# Patient Record
Sex: Male | Born: 1975 | Race: White | Hispanic: No | Marital: Married | Smoking: Never smoker
Health system: Southern US, Community
[De-identification: ages and names within clinical notes are randomized; demographics above are authoritative.]

## PROBLEM LIST (undated history)

## (undated) DIAGNOSIS — Z8619 Personal history of other infectious and parasitic diseases: Secondary | ICD-10-CM

## (undated) DIAGNOSIS — G4730 Sleep apnea, unspecified: Secondary | ICD-10-CM

## (undated) DIAGNOSIS — Z8639 Personal history of other endocrine, nutritional and metabolic disease: Secondary | ICD-10-CM

## (undated) DIAGNOSIS — E079 Disorder of thyroid, unspecified: Secondary | ICD-10-CM

## (undated) HISTORY — DX: Personal history of other endocrine, nutritional and metabolic disease: Z86.39

## (undated) HISTORY — DX: Personal history of other infectious and parasitic diseases: Z86.19

---
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
## 2000-03-29 ENCOUNTER — Encounter: Payer: Self-pay | Admitting: Emergency Medicine

## 2000-03-29 ENCOUNTER — Emergency Department (HOSPITAL_COMMUNITY): Admission: EM | Admit: 2000-03-29 | Discharge: 2000-03-29 | Payer: Self-pay | Admitting: Emergency Medicine

## 2008-06-08 ENCOUNTER — Encounter (INDEPENDENT_AMBULATORY_CARE_PROVIDER_SITE_OTHER): Payer: Self-pay | Admitting: Surgery

## 2008-06-08 ENCOUNTER — Ambulatory Visit (HOSPITAL_COMMUNITY): Admission: EM | Admit: 2008-06-08 | Discharge: 2008-06-09 | Payer: Self-pay | Admitting: Emergency Medicine

## 2008-06-08 ENCOUNTER — Encounter: Admission: RE | Admit: 2008-06-08 | Discharge: 2008-06-08 | Payer: Self-pay | Admitting: Family Medicine

## 2008-10-24 ENCOUNTER — Emergency Department (HOSPITAL_BASED_OUTPATIENT_CLINIC_OR_DEPARTMENT_OTHER): Admission: EM | Admit: 2008-10-24 | Discharge: 2008-10-24 | Payer: Self-pay | Admitting: Emergency Medicine

## 2009-07-16 IMAGING — CT CT PELVIS W/ CM
2 of 5 series · 13 of 32 positions shown, 18 images · IV contrast (30CC OMNI 350 & [ID] OMNI 300)
Comparison: None

CT ABDOMEN

CLINICAL DATA: RIGHT LOWER QUADRANT AND PELVIC PAIN FOR 4 DAYS.
ELEVATED WHITE BLOOD CELL COUNT.

CT ABDOMEN AND PELVIS WITH CONTRAST
TECHNIQUE: Multidetector CT imaging of the abdomen and pelvis was
performed following the standard protocol following the bolus
administration of intravenous contrast.
Contrast: 100 ml [id number]

[Series 2: abdomen w/ · axial · 0.76mm/px · z∈[-369,-9]mm · 7 of 98 slices shown, 12 images]
[im 13/98  soft-tissue]
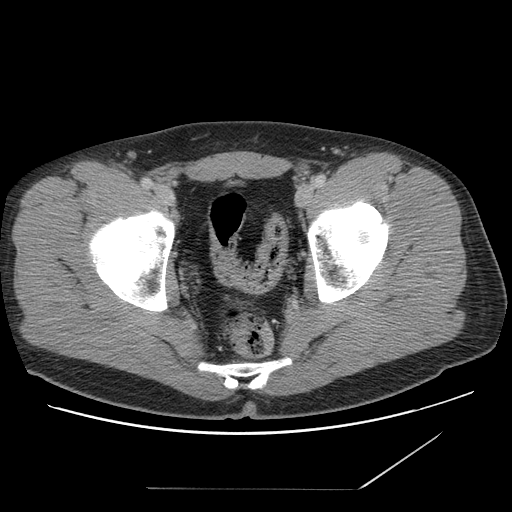
[im 13/98  bone]
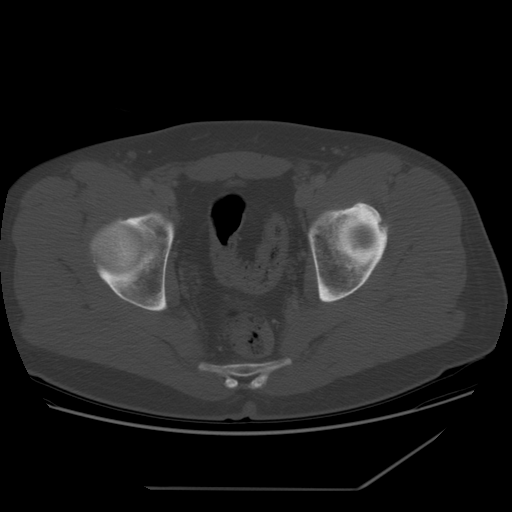
[im 25/98  soft-tissue]
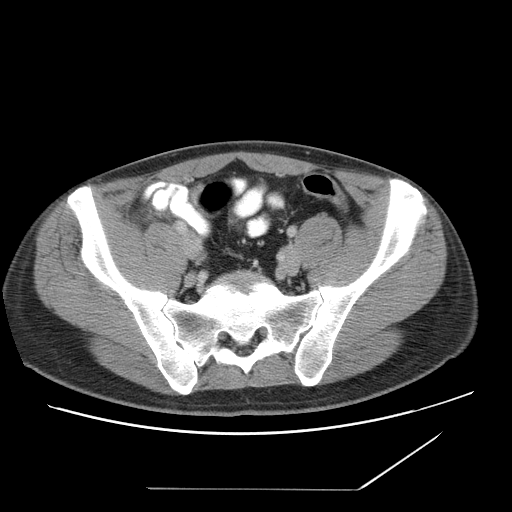
[im 37/98  soft-tissue]
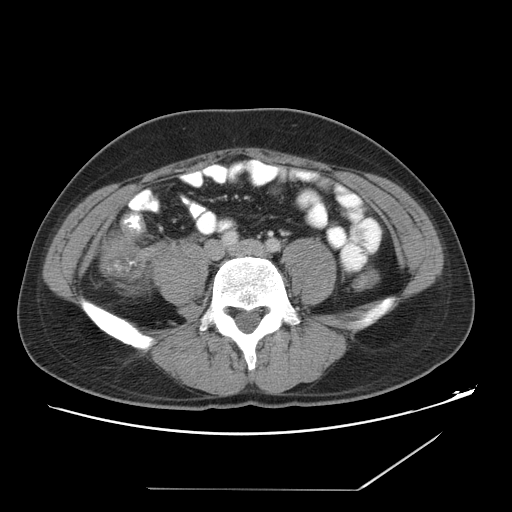
[im 49/98  soft-tissue]
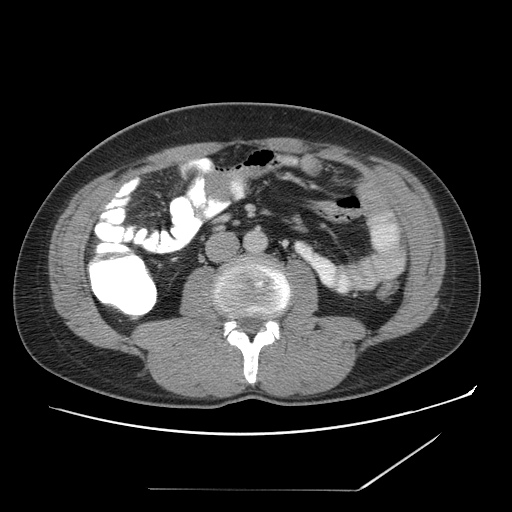
[im 49/98  lung]
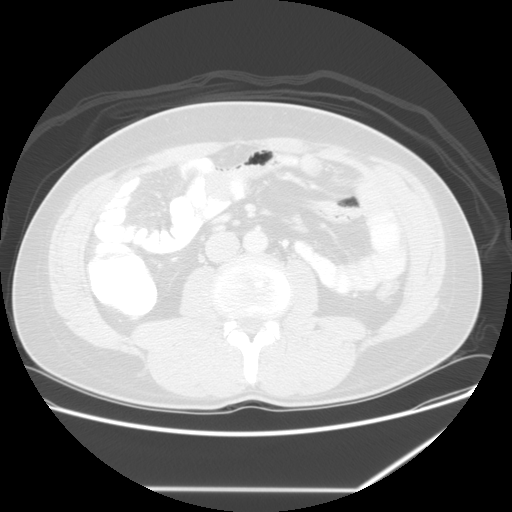
[im 61/98  soft-tissue]
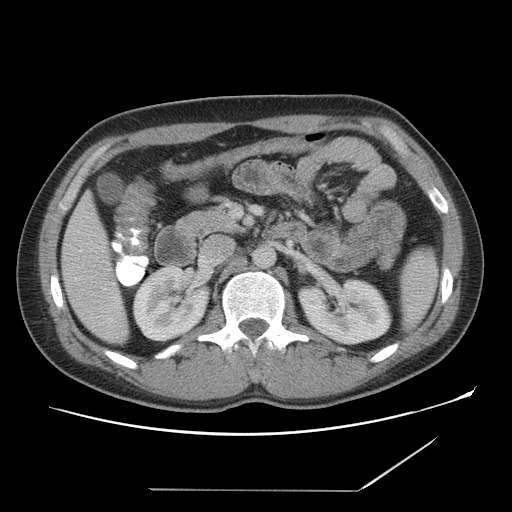
[im 61/98  lung]
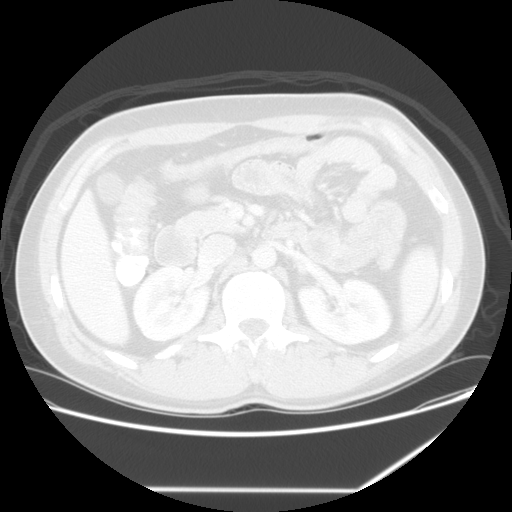
[im 73/98  soft-tissue]
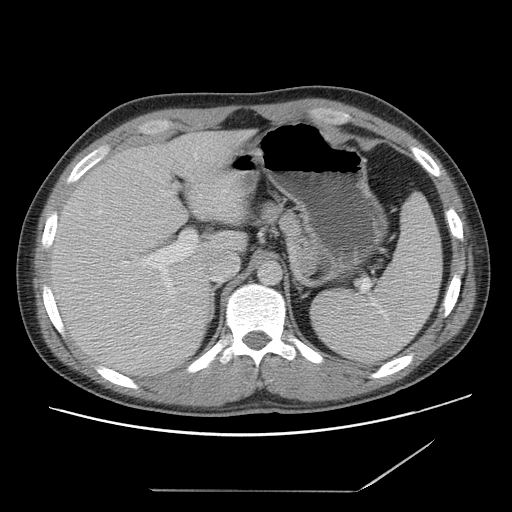
[im 73/98  lung]
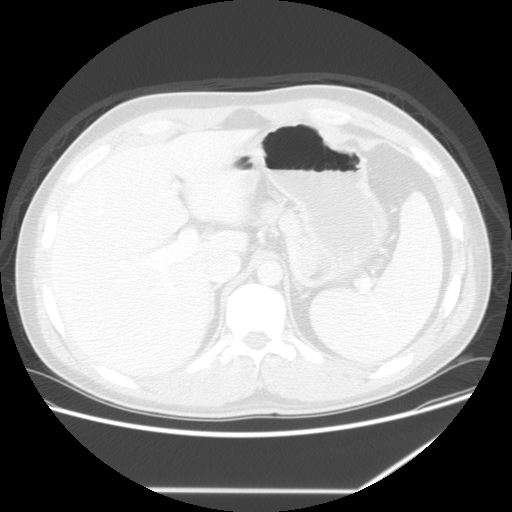
[im 85/98  soft-tissue]
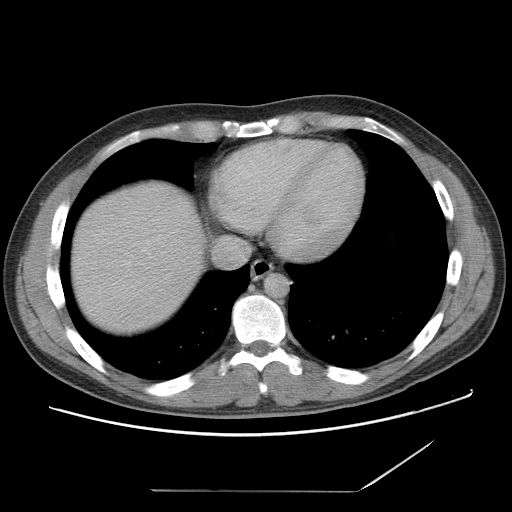
[im 85/98  lung]
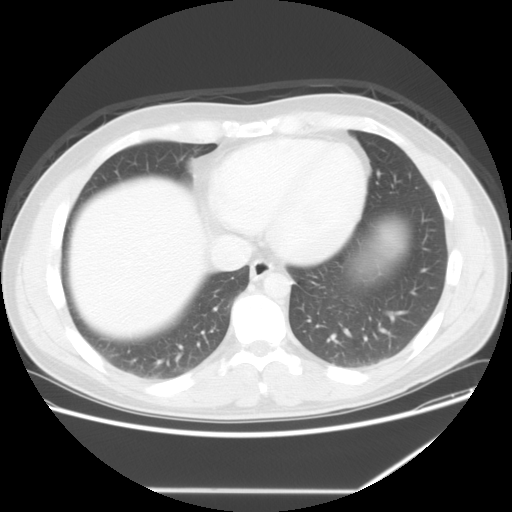

[Series 401: sagittal · sagittal · 1.08mm/px · 6 of 140 slices shown]
[im 14/140  soft-tissue]
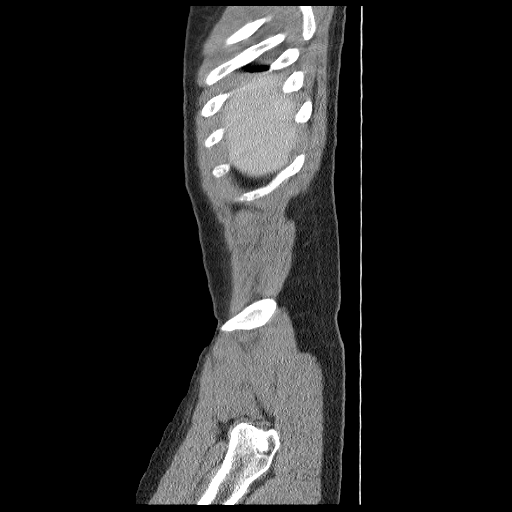
[im 28/140  soft-tissue]
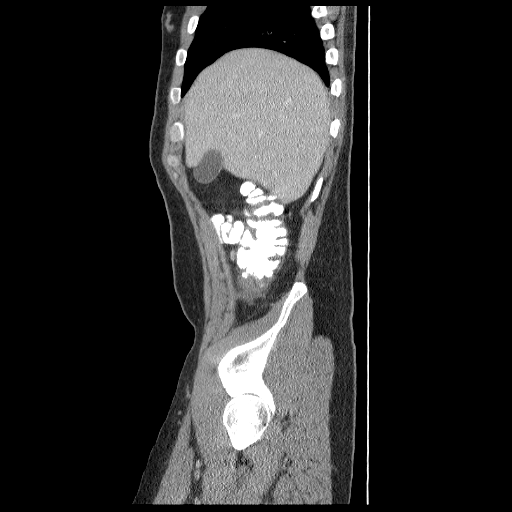
[im 42/140  soft-tissue]
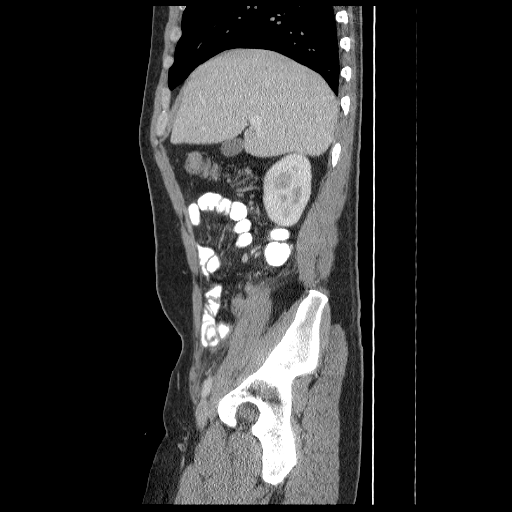
[im 56/140  soft-tissue]
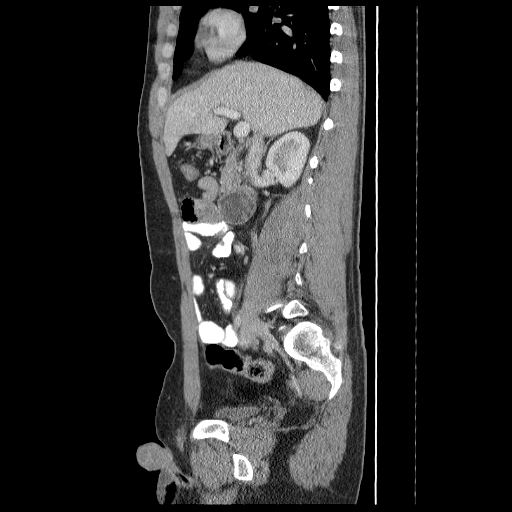
[im 84/140  soft-tissue]
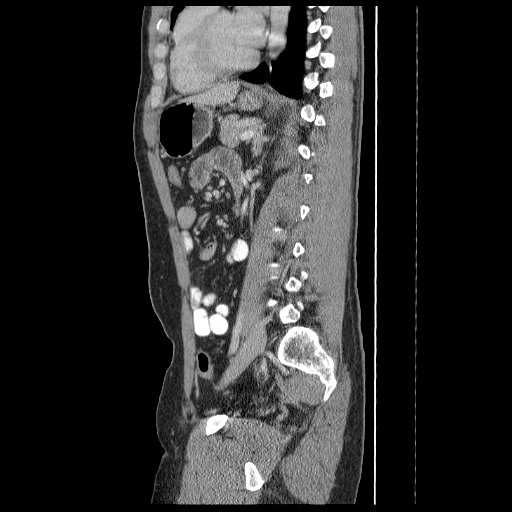
[im 98/140  soft-tissue]
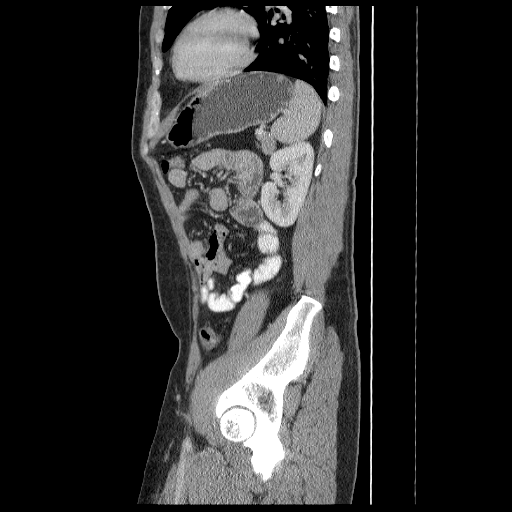

[13 of 32 positions shown; findings below may reference images not displayed]

FINDINGS: Clear lung bases.  Normal heart size without pericardial
or pleural effusion.

Normal liver, spleen, stomach, pancreas, gallbladder, biliary tree,
adrenal glands.

Probable left upper pole punctate renal calculus versus early
contrast excretion.  Normal right kidney.  Small retroperitoneal
lymph nodes. No retroperitoneal or retrocrural adenopathy.
Transverse colonic underdistension.  An area of possible hepatic
flexure colonic wall thickening on image 37 could alternatively be
secondary to underdistension.  The ascending colon is normal.

Marked inflammatory process centered at the tip of the cecum.  The
appendix is felt to be visualized on axial image 62 and coronal
image 38.  It is enlarged at 9 mm.  The terminal ileum is mildly
thick-walled but felt to be secondarily inflamed with the majority
of the inflammatory process centered more posteriorly at the cecal
tip and origin of the appendix.  No drainable abscess.  Mild
prominence of proximal small bowel loops likely relates to a
component of secondary adynamic ileus.  Prominent ileocolic
mesenteric lymph nodes which are likely reactive.  Index node
measures 8 mm on image 55.  No free perforation.
IMPRESSION: 1.  Moderate to marked inflammatory process centered at the cecal
tip felt to most likely relate to appendicitis.  The terminal ileum
is more mildly inflamed, felt to be secondarily.  Given the poor
visualization of the appendix, a component of perforation cannot be
excluded but there is no evidence of drainable abscess.
2.  Apparent hepatic flexure colonic wall thickening may be due to
underdistension. Consider follow up imaging to exclude alternate
etiologies.
3.  Possible nonobstructive left upper pole renal calculus.

I called and personally discussed this report with Dr. Keinosuke at

CT PELVIS
FINDINGS: Normal pelvic bowel loops.  No pelvic adenopathy or
ascites.  Normal urinary bladder and prostate. No acute osseous
abnormality.
IMPRESSION: 1.  No acute pelvic process.

## 2009-12-01 IMAGING — CT CT HEAD W/O CM
1 series · 16 of 30 positions shown, 20 images · non-contrast
Comparison: None available.

CLINICAL DATA: Syncope.

CT HEAD WITHOUT CONTRAST
TECHNIQUE: Contiguous axial images were obtained from the base of
the skull through the vertex without contrast.

[Series 2: head 4.8 h37s · axial · 0.45mm/px · z∈[-131,+2]mm · 16 of 32 slices shown, 20 images]
[im 2/32  brain]
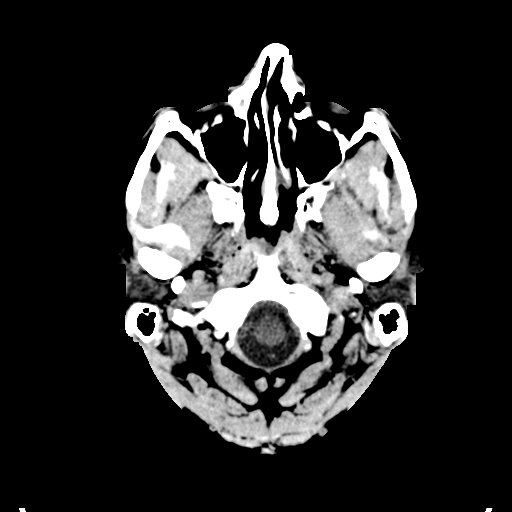
[im 2/32  bone]
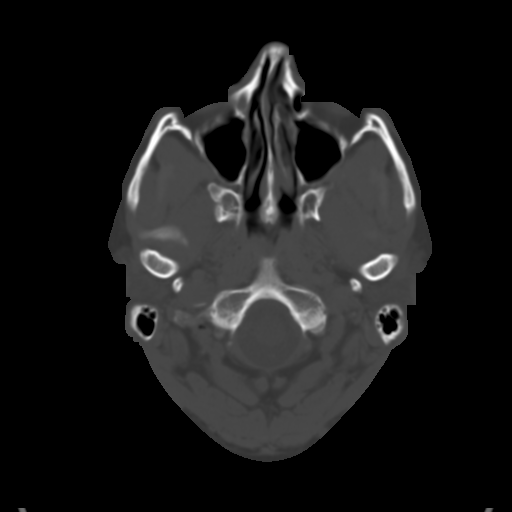
[im 4/32  brain]
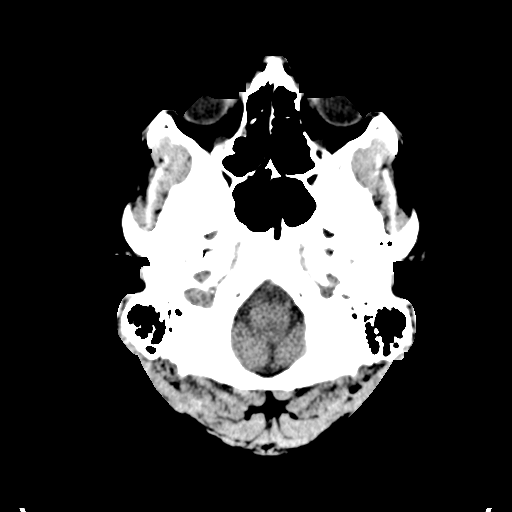
[im 6/32  brain]
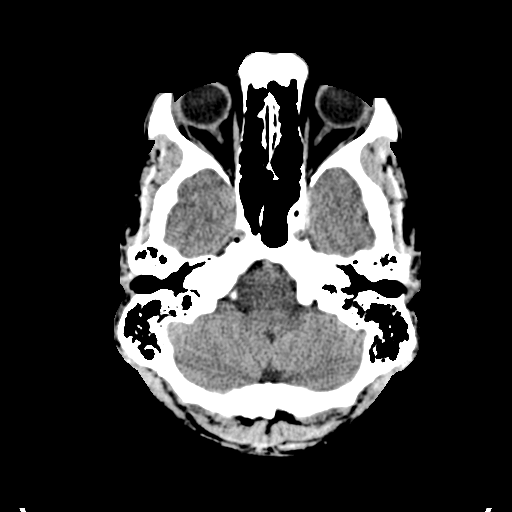
[im 8/32  brain]
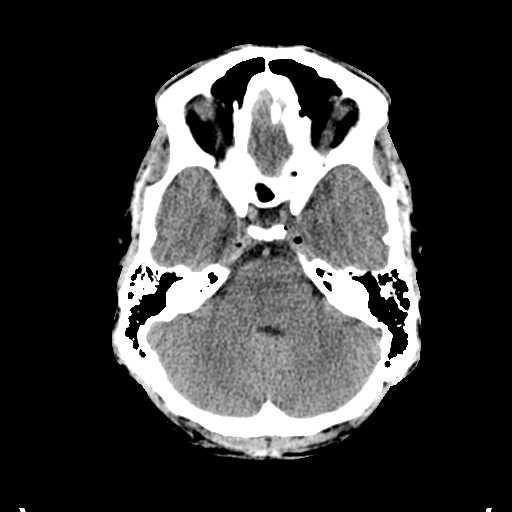
[im 9/32  brain]
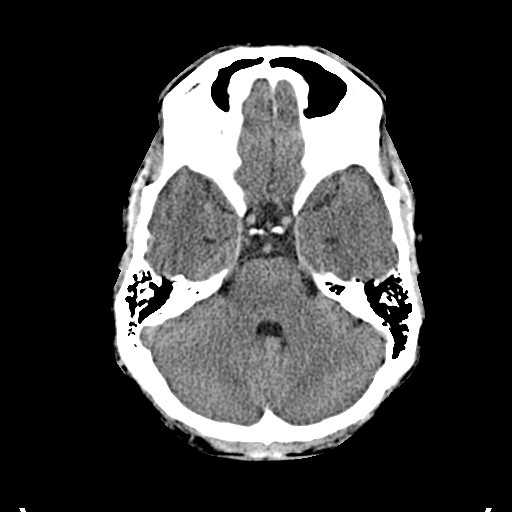
[im 9/32  bone]
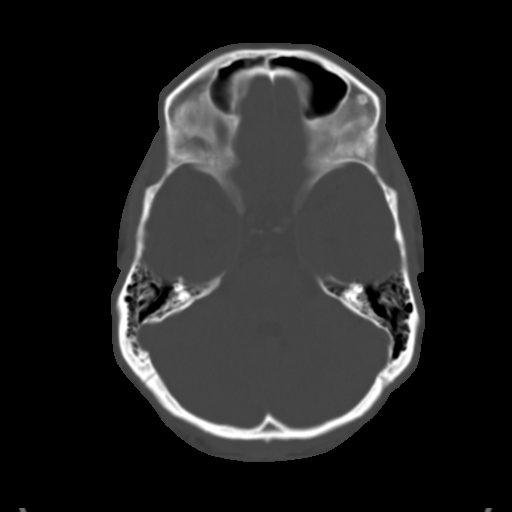
[im 11/32  brain]
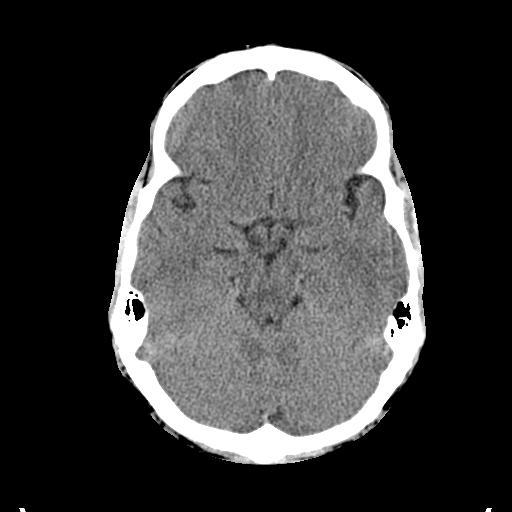
[im 13/32  brain]
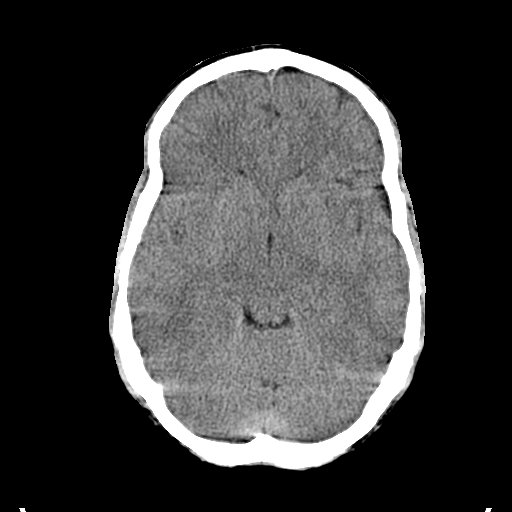
[im 15/32  brain]
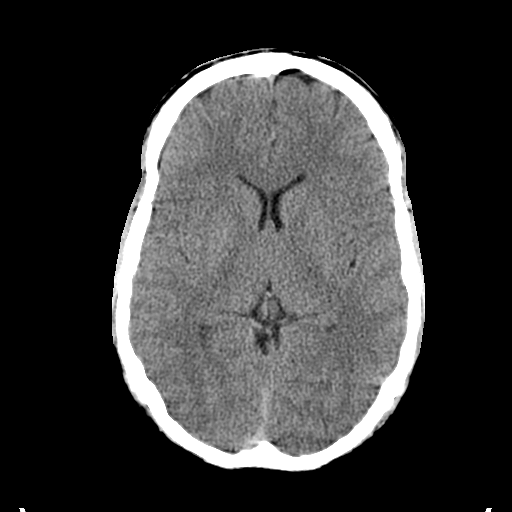
[im 17/32  brain]
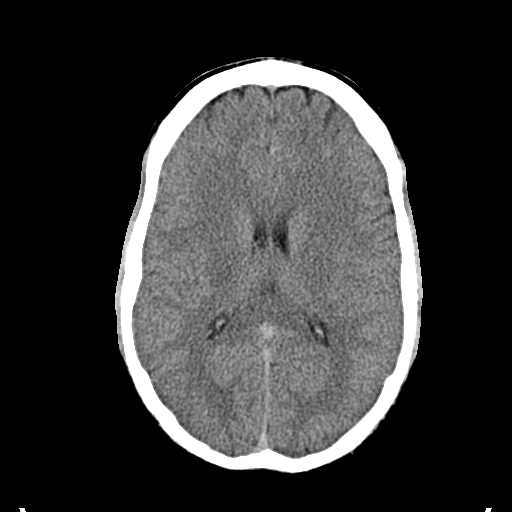
[im 17/32  bone]
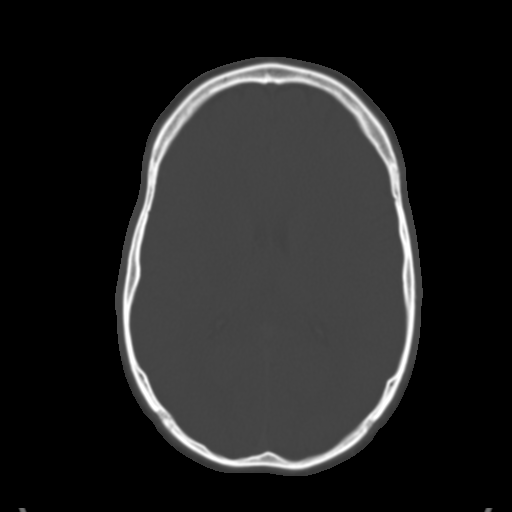
[im 19/32  brain]
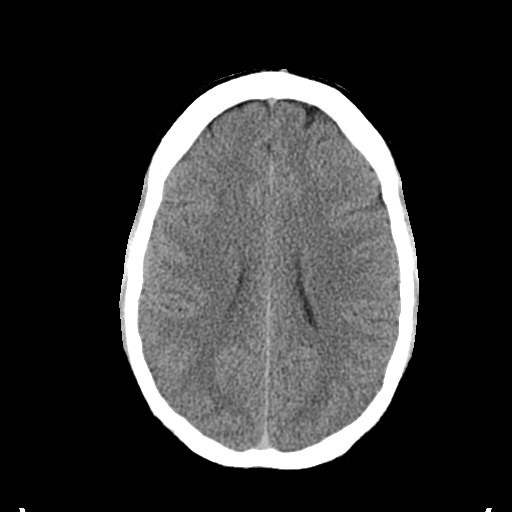
[im 21/32  brain]
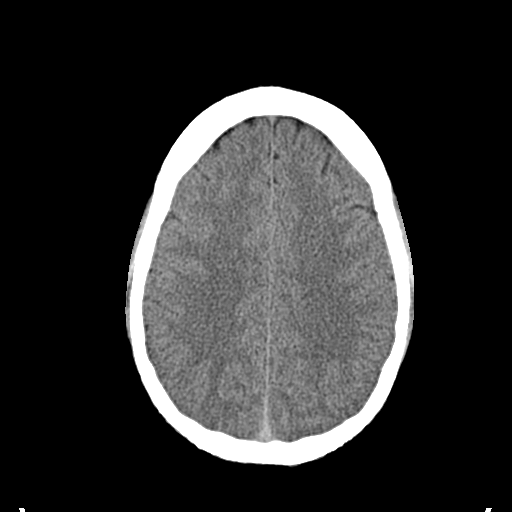
[im 23/32  brain]
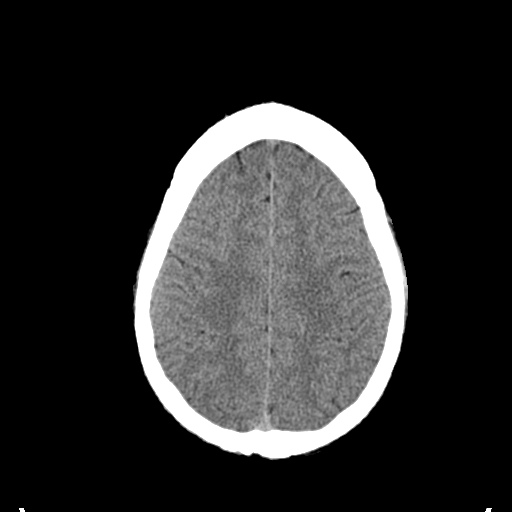
[im 24/32  brain]
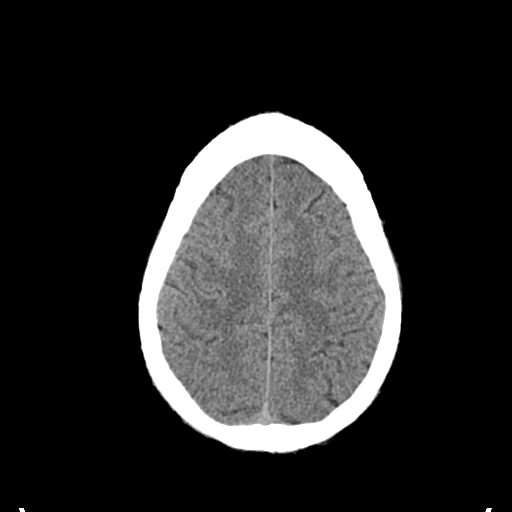
[im 24/32  bone]
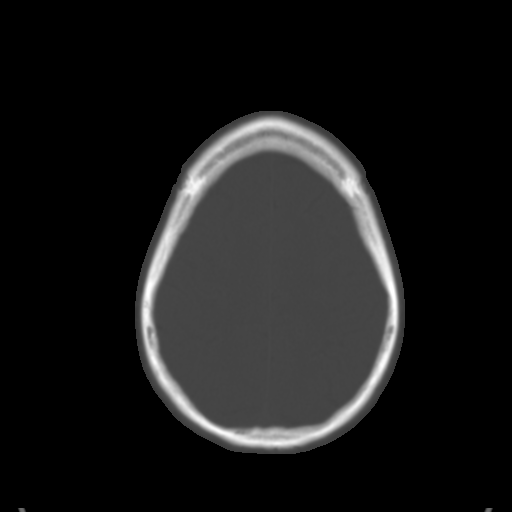
[im 26/32  brain]
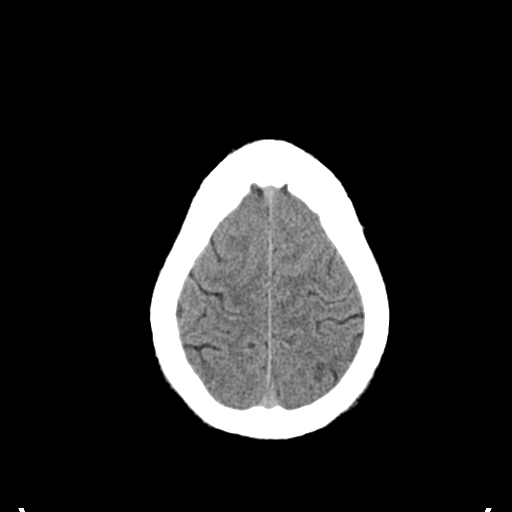
[im 28/32  brain]
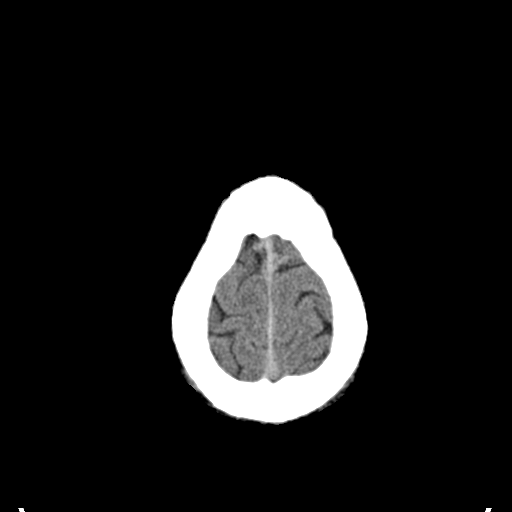
[im 30/32  brain]
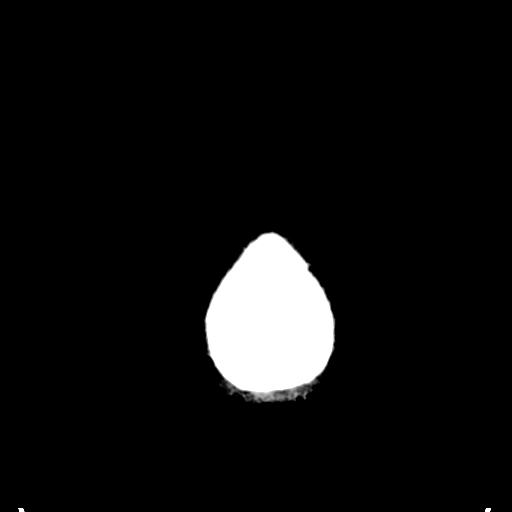

[16 of 30 positions shown; findings below may reference images not displayed]

FINDINGS: The brain appears normal without evidence of hemorrhage,
infarct, mass, mass effect, midline shift or abnormal extra-axial
fluid collection.  There is no hydrocephalus.  Imaged paranasal
sinuses and mastoid air cells are clear.
IMPRESSION: Normal head CT.

## 2009-12-15 HISTORY — PX: APPENDECTOMY: SHX54

## 2011-04-29 NOTE — Op Note (Signed)
NAME:  Adam Weber, Adam Weber NO.:  [id number]   MEDICAL RECORD NO.:  [id number]          PATIENT TYPE:  INP   LOCATION:  0098                         FACILITY:  [facility]   PHYSICIAN:  Wilmon Arms. Corliss Skains, M.D. DATE OF BIRTH:  Apr 22, 1976   DATE OF PROCEDURE:  06/08/2008  DATE OF DISCHARGE:                               OPERATIVE REPORT   PREOPERATIVE DIAGNOSIS:  Acute appendicitis.   POSTOPERATIVE DIAGNOSIS:  Acute appendicitis.   PROCEDURE PERFORMED:  Laparoscopic appendectomy.   SURGEON:  Wilmon Arms. Tsuei, M.D.   ANESTHESIA:  General endotracheal.   INDICATIONS:  The patient is a 35 year old male who presents with 3 days  of right lower quadrant pain.  His primary care physician evaluated him  and noted him to have an elevated white count.  A CT scan showed acute  appendicitis.  The patient was referred to [facility] Emergency  Department for surgical evaluation.  I examined him and recommended  immediate appendectomy.   DESCRIPTION OF PROCEDURE:  The patient was brought to the operating room  and placed in the supine position on the operating room table.  After an  adequate level of general anesthesia was obtained, the patient's abdomen  was shaved, prepped with Betadine and draped in a sterile fashion.  A  Foley catheter was placed under sterile technique.  A time-out was taken  to ensure the proper patient and proper procedure.  We infiltrated the  area below his umbilicus with quarter-percent Marcaine with epinephrine.  A transverse incision was made and dissection was carried down to the  fascia.  The fascia was opened vertically.  The peritoneal cavity was  bluntly entered.  A stay sutures of 0 Vicryl was placed around the  fascial opening.  The Hasson cannula was inserted and secured with the  stay suture.  Pneumoperitoneum was obtained by insufflating CO2,  maintaining a maximum pressure of 15 mmHg.  The 5 mm 30 degree  laparoscope was inserted and no gross  purulence was noted.  A 5 mm port  was placed in the right upper quadrant.  Another 5 mm port was placed in  left lower quadrant.  The scope was moved to the right upper quadrant  port site.  Glassman clamps were used to mobilize the cecum medially.  There were dense inflammatory adhesions to the lateral abdominal wall,  and these were broken up bluntly.  There was some fibrinous exudate, but  no free purulent fluid.  We were able to carefully bluntly dissect free  a very inflamed, but nonperforated appendix.  We were able to mobilize  the tip up into the air.  We continued dissecting back by taking the  mesoappendix with the harmonic scalpel.  We continued back until we were  at the base of the appendix at the cecum.  The base of the appendix was  then divided with a Endo-GIA stapler blue load.  The appendix was then  placed in an EndoCatch sac and removed through the umbilical port site.  We inspected the staple line and hemostasis was good.  We then  thoroughly irrigated the right  lower quadrant.  No gross purulence was  noted.  We suctioned out as much irrigation as possible.  The ports were  then removed as pneumoperitoneum was released.  The  pursestring sutures was used to close the umbilical fascia.  A 4-0  Monocryl was used to close the skin incisions.  Steri-Strips and clean  dressings were applied.  The patient was extubated and taken to the  recovery room in stable condition.  All sponge, instrument and needle  counts were correct.      Wilmon Arms. Tsuei, M.D.  Electronically Signed     MKT/MEDQ  D:  06/08/2008  T:  06/08/2008  Job:  [id number]

## 2011-09-11 LAB — DIFFERENTIAL
Eosinophils Absolute: 0.1
Eosinophils Relative: 1
Lymphocytes Relative: 17
Lymphs Abs: 1.6
Monocytes Absolute: 0.7
Monocytes Relative: 7

## 2011-09-11 LAB — BASIC METABOLIC PANEL
BUN: 14
Chloride: 101
GFR calc non Af Amer: >60
Potassium: 4.1
Sodium: 137

## 2011-09-11 LAB — CBC
HCT: 44.6
Hemoglobin: 15.7
MCV: 85.4
RBC: 5.22
WBC: 9.5

## 2011-09-16 LAB — BASIC METABOLIC PANEL
BUN: 16
CO2: 28
Chloride: 100
Creatinine, Ser: 1.1
Potassium: 4.7

## 2011-09-16 LAB — CBC
HCT: 46.9
MCHC: 34.8
MCV: 85.5
Platelets: 179
WBC: 10

## 2011-09-16 LAB — DIFFERENTIAL
Basophils Relative: 1
Eosinophils Absolute: 0
Eosinophils Relative: 0
Lymphs Abs: 1
Monocytes Relative: 9
Neutrophils Relative %: 79 — ABNORMAL HIGH

## 2011-09-16 LAB — D-DIMER, QUANTITATIVE: D-Dimer, Quant: <0.22

## 2013-05-17 ENCOUNTER — Ambulatory Visit (INDEPENDENT_AMBULATORY_CARE_PROVIDER_SITE_OTHER): Payer: BC Managed Care – PPO | Admitting: Family Medicine

## 2013-05-17 ENCOUNTER — Encounter: Payer: Self-pay | Admitting: Family Medicine

## 2013-05-17 VITALS — BP 128/86 | HR 60 | Temp 98.7°F | Ht 72.25 in | Wt 218.0 lb

## 2013-05-17 DIAGNOSIS — E781 Pure hyperglyceridemia: Secondary | ICD-10-CM

## 2013-05-17 DIAGNOSIS — E039 Hypothyroidism, unspecified: Secondary | ICD-10-CM | POA: Insufficient documentation

## 2013-05-17 NOTE — Assessment & Plan Note (Signed)
Inherited Overall very good diet and exercise Tolerates fenofibrate Sent for last mo labs from [facility] physician

## 2013-05-17 NOTE — Assessment & Plan Note (Signed)
Had dose change 5 wk ago tsh today Feels better  No clinical issues and no goiter

## 2013-05-17 NOTE — Patient Instructions (Addendum)
Please send for last note and labs and immunization records from [facility] family practice  Thyroid lab today  Take care of yourself

## 2013-05-17 NOTE — Progress Notes (Signed)
Subjective:    Patient ID: Adam Weber, male    DOB: March 06, 1976, 37 y.o.   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 How high it was originally Last check was about a month  Diet is fair - could be a little careful in general  Not a smoker  Alcohol- occasional    Is very active (PE teacher)  Hypothyroid  Never had hyperthyroidism  Mother had hypothyroid  On levothyroid 50 mcg - (1 mo ago inc dose)  No symptoms now - but started out tired - much better now  Never had a goiter   Has 2 kids  Ages 53, and 3 1/2   He does have sinus problems  Just finished up a round of amoxicillin for pharyngitis  Uri symptoms  No asthma  ? Allergies - perhaps some seasonal symptoms     Patient Active Problem List   Diagnosis Date Noted  . 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 Unspecified hypothyroidism 05/17/2013   Past Medical History  Diagnosis Date  . History of chicken pox   . History of hyperlipidemia    Past Surgical History  Procedure Laterality Date  . Hand surgery  1997  . Appendectomy  2011   History  Substance Use Topics  . Smoking status: Never Smoker   . Smokeless tobacco: Not on file  . Alcohol Use: Yes     Comment: occ   Family History  Problem Relation Age of Onset  . Cancer - Lung Paternal Grandfather   . Hyperlipidemia Maternal Grandmother   . Stroke Maternal Grandmother   . Hypertension Maternal Grandmother   . Diabetes Paternal Grandmother     well controlled with good diet   No Known Allergies No current outpatient prescriptions on file prior to visit.   No current facility-administered medications on file prior to visit.    Review of Systems Review of Systems  Constitutional: Negative for fever, appetite change, fatigue and unexpected weight change.  Eyes:  Negative for pain and visual disturbance.  Respiratory: Negative for cough and shortness of breath.   Cardiovascular: Negative for cp or palpitations    Gastrointestinal: Negative for nausea, diarrhea and constipation.  Genitourinary: Negative for urgency and frequency.  Skin: Negative for pallor or rash   Neurological: Negative for weakness, light-headedness, numbness and headaches.  Hematological: Negative for adenopathy. Does not bruise/bleed easily.  Psychiatric/Behavioral: Negative for dysphoric mood. The patient is not nervous/anxious.         Objective:   Physical Exam  Constitutional: He appears well-developed and well-nourished. No distress.  HENT:  Head: Normocephalic and atraumatic.  Right Ear: External ear normal.  Left Ear: External ear normal.  Nose: Nose normal.  Mouth/Throat: Oropharynx is clear and moist.  Eyes: Conjunctivae and EOM are normal. Pupils are equal, round, and reactive to light. Right eye exhibits no discharge. Left eye exhibits no discharge. No scleral icterus.  Neck: Normal range of motion. Neck supple. No JVD present. Carotid bruit is not present. No thyromegaly present.  Cardiovascular: Normal rate, regular rhythm, normal heart sounds and intact distal pulses.  Exam reveals no gallop.   Pulmonary/Chest: Effort normal and breath sounds normal. No respiratory distress. He has no wheezes.  Abdominal: Soft. Bowel sounds are normal. He exhibits no distension,  no abdominal bruit and no mass. There is no tenderness.  Musculoskeletal: He exhibits no edema.  Lymphadenopathy:    He has no cervical adenopathy.  Neurological: He is alert. He has normal reflexes. No cranial nerve deficit. He exhibits normal muscle tone. Coordination normal.  Skin: Skin is warm and dry. No rash noted.  Psychiatric: He has a normal mood and affect.          Assessment & Plan:

## 2013-05-18 LAB — TSH: TSH: 2.96 u[IU]/mL (ref 0.35–5.50)

## 2013-05-19 ENCOUNTER — Encounter: Payer: Self-pay | Admitting: *Deleted

## 2013-05-30 ENCOUNTER — Encounter: Payer: Self-pay | Admitting: Family Medicine

## 2013-05-30 ENCOUNTER — Ambulatory Visit (INDEPENDENT_AMBULATORY_CARE_PROVIDER_SITE_OTHER): Payer: BC Managed Care – PPO | Admitting: Family Medicine

## 2013-05-30 VITALS — BP 144/84 | HR 70 | Temp 98.4°F | Wt 219.5 lb

## 2013-05-30 DIAGNOSIS — J302 Other seasonal allergic rhinitis: Secondary | ICD-10-CM

## 2013-05-30 DIAGNOSIS — J309 Allergic rhinitis, unspecified: Secondary | ICD-10-CM

## 2013-05-30 MED ORDER — FLUTICASONE PROPIONATE 50 MCG/ACT NA SUSP: NASAL | Status: DC

## 2013-05-30 NOTE — Progress Notes (Signed)
3 weeks with sinus infection.  Was on amoxil.  Voice changes noted.  No fevers.  Done with abx now.  Running a yard/lawn business. Irritation in throat and sinus pressure noted.  nyquil at night, minimal help.  No ear pain but some popping with swallowing.  Usually has seasonal sx in the summertime.  Haven't tried nasal saline.    Meds, vitals, and allergies reviewed.   ROS: See HPI.  Otherwise, noncontributory.  GEN: nad, alert and oriented HEENT: mucous membranes moist, tm w/o erythema, nasal exam w/o erythema, clear discharge noted,  OP with cobblestoning, B SOM noted.  NECK: supple w/o LA CV: rrr.   PULM: ctab, no inc wob EXT: no edema SKIN: no acute rash

## 2013-05-30 NOTE — Patient Instructions (Addendum)
Use nasal saline and take claritin 10mg  a day (loratadine).   If not better, use the flonase.  Take care.

## 2013-06-01 DIAGNOSIS — J302 Other seasonal allergic rhinitis: Secondary | ICD-10-CM | POA: Insufficient documentation

## 2013-06-01 NOTE — Assessment & Plan Note (Signed)
Likely dx, would use nasal saline, claritin, then flonase.  He agrees.  Nontoxic.  Doesn't appear to be infectious currently.

## 2013-06-06 ENCOUNTER — Telehealth: Payer: Self-pay

## 2013-06-06 MED ORDER — AMOXICILLIN 875 MG PO TABS: 875.0000 mg | ORAL_TABLET | Freq: Two times a day (BID) | ORAL | Status: DC

## 2013-06-06 NOTE — Telephone Encounter (Signed)
Patient advised.

## 2013-06-06 NOTE — Telephone Encounter (Signed)
Sent.  Thanks.  F/u prn.  

## 2013-06-06 NOTE — Telephone Encounter (Signed)
Pt seen 05/30/13 with sinus & allergy issues; claritin and flonase not helping; pt request antibiotic, has taken amoxicillin in past; presently at beach; no fever, productive cough with green-yellow mucus, pressure in ears and head congestion.[facility]; pt request call back.

## 2013-06-07 ENCOUNTER — Encounter: Payer: Self-pay | Admitting: Family Medicine

## 2013-12-05 ENCOUNTER — Encounter: Payer: Self-pay | Admitting: Family Medicine

## 2013-12-05 ENCOUNTER — Ambulatory Visit (INDEPENDENT_AMBULATORY_CARE_PROVIDER_SITE_OTHER): Payer: BC Managed Care – PPO | Admitting: Family Medicine

## 2013-12-05 VITALS — BP 108/64 | HR 68 | Temp 98.1°F | Wt 237.5 lb

## 2013-12-05 DIAGNOSIS — J069 Acute upper respiratory infection, unspecified: Secondary | ICD-10-CM | POA: Insufficient documentation

## 2013-12-05 MED ORDER — AMOXICILLIN 875 MG PO TABS: 875.0000 mg | ORAL_TABLET | Freq: Two times a day (BID) | ORAL | Status: AC

## 2013-12-05 NOTE — Progress Notes (Signed)
Pre-visit discussion using our clinic review tool. No additional management support is needed unless otherwise documented below in the visit note.  He was doing well until yesterday, hadn't been on flonase until recently.  Sx started with nasal congestion, stuffy, coughing from postnasal gtt, ST likely from cough.  Had to sleep with mouth open. No fevers.  No ear pain.  No pulmonary sputum, but some throat clearing.  No wheeze.  Flu shot prev done at work.  H/o similar about 2x/year.   Meds, vitals, and allergies reviewed.   ROS: See HPI.  Otherwise, noncontributory.  GEN: nad, alert and oriented HEENT: mucous membranes moist, tm w/o erythema, nasal exam w/o erythema, clear discharge noted,  OP with cobblestoning, sinuses not ttp x4 NECK: supple w/o LA CV: rrr.   PULM: ctab, no inc wob EXT: no edema SKIN: no acute rash

## 2013-12-05 NOTE — Patient Instructions (Signed)
Keep using the flonase and nasal saline.  If not better in a few days, then start the amoxil.  Take care.  Glad to see you.

## 2013-12-05 NOTE — Assessment & Plan Note (Signed)
Likely viral, use nasal saline and flonase for now.  [facility] rx for amoxil given to patient, start if prolonged sx. He agrees, nontoxic.  F/u prn.

## 2013-12-15 HISTORY — PX: ELBOW SURGERY: SHX618

## 2014-06-22 ENCOUNTER — Other Ambulatory Visit: Payer: Self-pay

## 2014-06-22 MED ORDER — LEVOTHYROXINE SODIUM 50 MCG PO TABS: 50.0000 ug | ORAL_TABLET | Freq: Every day | ORAL | Status: DC

## 2014-06-22 NOTE — Telephone Encounter (Signed)
Pt hasn't been seen in over a year, pt only had 1 appt with you and that was an est care appt on 05/17/2013

## 2014-06-22 NOTE — Telephone Encounter (Signed)
Left voicemail requesting pt to call office 

## 2014-06-22 NOTE — Telephone Encounter (Signed)
Please refill for a year  

## 2014-06-22 NOTE — Telephone Encounter (Signed)
Please schedule f/u and refill until then  

## 2014-06-22 NOTE — Telephone Encounter (Signed)
F/u scheduled for 07/17/14 and med refilled for a month

## 2014-06-22 NOTE — Telephone Encounter (Signed)
Pt left v/m requesting refill levothyroxine to [facility];pt said [facility] had requested refill with no response; spoke [facility] Verdon CumminsJesse at [facility] and refill request went to Dr Barton DuboisKaplin. Left v/m advising pt of this info and sent note to Dr Milinda Antisower about approval of refill. Pt had new pt appt on 05/17/13 and last TSH 05/17/13. Please advise.

## 2014-06-27 ENCOUNTER — Other Ambulatory Visit: Payer: Self-pay

## 2014-06-27 MED ORDER — FENOFIBRATE 54 MG PO TABS: 54.0000 mg | ORAL_TABLET | Freq: Every day | ORAL | Status: DC

## 2014-06-27 NOTE — Telephone Encounter (Signed)
Pt left v/m requesting refill fenofibrate to [facility]; Pt thought was going to be refilled on 06/22/14. Left v/m for pt refill # 30 to [facility] and get refills updated at 07/17/14 appt.

## 2014-07-17 ENCOUNTER — Ambulatory Visit (INDEPENDENT_AMBULATORY_CARE_PROVIDER_SITE_OTHER): Payer: BC Managed Care – PPO | Admitting: Family Medicine

## 2014-07-17 ENCOUNTER — Encounter: Payer: Self-pay | Admitting: Family Medicine

## 2014-07-17 VITALS — BP 124/78 | HR 57 | Temp 98.1°F | Ht 72.25 in | Wt 237.5 lb

## 2014-07-17 DIAGNOSIS — E039 Hypothyroidism, unspecified: Secondary | ICD-10-CM

## 2014-07-17 DIAGNOSIS — E781 Pure hyperglyceridemia: Secondary | ICD-10-CM

## 2014-07-17 LAB — COMPREHENSIVE METABOLIC PANEL
ALBUMIN: 4.2 g/dL (ref 3.5–5.2)
ALT: 20 U/L (ref 0–53)
AST: 21 U/L (ref 0–37)
Alkaline Phosphatase: 61 U/L (ref 39–117)
BILIRUBIN TOTAL: 0.6 mg/dL (ref 0.2–1.2)
BUN: 12 mg/dL (ref 6–23)
CALCIUM: 9.1 mg/dL (ref 8.4–10.5)
CHLORIDE: 105 meq/L (ref 96–112)
CO2: 26 meq/L (ref 19–32)
Creatinine, Ser: 1.2 mg/dL (ref 0.4–1.5)
GFR: 70.48 mL/min (ref >60.00)
GLUCOSE: 93 mg/dL (ref 70–99)
POTASSIUM: 5.2 meq/L — AB (ref 3.5–5.1)
SODIUM: 136 meq/L (ref 135–145)
TOTAL PROTEIN: 6.9 g/dL (ref 6.0–8.3)

## 2014-07-17 LAB — LIPID PANEL
CHOLESTEROL: 190 mg/dL (ref 0–200)
HDL: 31.6 mg/dL — ABNORMAL LOW (ref >39.00)
LDL Cholesterol: 122 mg/dL — ABNORMAL HIGH (ref 0–99)
NonHDL: 158.4
Total CHOL/HDL Ratio: 6
Triglycerides: 183 mg/dL — ABNORMAL HIGH (ref 0.0–149.0)
VLDL: 36.6 mg/dL (ref 0.0–40.0)

## 2014-07-17 LAB — TSH: TSH: 4.05 u[IU]/mL (ref 0.35–4.50)

## 2014-07-17 MED ORDER — LEVOTHYROXINE SODIUM 50 MCG PO TABS: 50.0000 ug | ORAL_TABLET | Freq: Every day | ORAL | Status: DC

## 2014-07-17 MED ORDER — FENOFIBRATE 54 MG PO TABS
54.0000 mg | ORAL_TABLET | Freq: Every day | ORAL | Status: DC
Start: 2014-07-17 — End: 2015-07-27

## 2014-07-17 NOTE — Patient Instructions (Signed)
Take care of yourself  Think about an exercise program  Eat low fat also  Lab today    Fat and Cholesterol Control Diet Fat and cholesterol levels in your blood and organs are influenced by your diet. High levels of fat and cholesterol may lead to diseases of the heart, small and large blood vessels, gallbladder, liver, and pancreas. CONTROLLING FAT AND CHOLESTEROL WITH DIET Although exercise and lifestyle factors are important, your diet is key. That is because certain foods are known to raise cholesterol and others to lower it. The goal is to balance foods for their effect on cholesterol and more importantly, to replace saturated and trans fat with other types of fat, such as monounsaturated fat, polyunsaturated fat, and omega-3 fatty acids. On average, a person should consume no more than 15 to 17 g of saturated fat daily. Saturated and trans fats are considered "bad" fats, and they will raise LDL cholesterol. Saturated fats are primarily found in animal products such as meats, butter, and cream. However, that does not mean you need to give up all your favorite foods. Today, there are good tasting, low-fat, low-cholesterol substitutes for most of the things you like to eat. Choose low-fat or nonfat alternatives. Choose round or loin cuts of red meat. These types of cuts are lowest in fat and cholesterol. Chicken (without the skin), fish, veal, and ground [place] breast are great choices. Eliminate fatty meats, such as hot dogs and salami. Even shellfish have little or no saturated fat. Have a 3 oz (85 g) portion when you eat lean meat, poultry, or fish. Trans fats are also called "partially hydrogenated oils." They are oils that have been scientifically manipulated so that they are solid at room temperature resulting in a longer shelf life and improved taste and texture of foods in which they are added. Trans fats are found in stick margarine, some tub margarines, cookies, crackers, and baked goods.   When baking and cooking, oils are a great substitute for butter. The monounsaturated oils are especially beneficial since it is believed they lower LDL and raise HDL. The oils you should avoid entirely are saturated tropical oils, such as coconut and palm.  Remember to eat a lot from food groups that are naturally free of saturated and trans fat, including fish, fruit, vegetables, beans, grains (barley, rice, couscous, bulgur wheat), and pasta (without cream sauces).  IDENTIFYING FOODS THAT LOWER FAT AND CHOLESTEROL  Soluble fiber may lower your cholesterol. This type of fiber is found in fruits such as apples, vegetables such as broccoli, potatoes, and carrots, legumes such as beans, peas, and lentils, and grains such as barley. Foods fortified with plant sterols (phytosterol) may also lower cholesterol. You should eat at least 2 g per day of these foods for a cholesterol lowering effect.  Read package labels to identify low-saturated fats, trans fat free, and low-fat foods at the supermarket. Select cheeses that have only 2 to 3 g saturated fat per ounce. Use a heart-healthy tub margarine that is free of trans fats or partially hydrogenated oil. When buying baked goods (cookies, crackers), avoid partially hydrogenated oils. Breads and muffins should be made from whole grains (whole-wheat or whole oat flour, instead of "flour" or "enriched flour"). Buy non-creamy canned soups with reduced salt and no added fats.  FOOD PREPARATION TECHNIQUES  Never deep-fry. If you must fry, either stir-fry, which uses very little fat, or use non-stick cooking sprays. When possible, broil, bake, or roast meats, and steam vegetables. Instead  of putting butter or margarine on vegetables, use lemon and herbs, applesauce, and cinnamon (for squash and sweet potatoes). Use nonfat yogurt, salsa, and low-fat dressings for salads.  LOW-SATURATED FAT / LOW-FAT FOOD SUBSTITUTES Meats / Saturated Fat (g)  Avoid: Steak, marbled (3  oz/85 g) / 11 g  Choose: Steak, lean (3 oz/85 g) / 4 g  Avoid: Hamburger (3 oz/85 g) / 7 g  Choose: Hamburger, lean (3 oz/85 g) / 5 g  Avoid: Ham (3 oz/85 g) / 6 g  Choose: Ham, lean cut (3 oz/85 g) / 2.4 g  Avoid: Chicken, with skin, dark meat (3 oz/85 g) / 4 g  Choose: Chicken, skin removed, dark meat (3 oz/85 g) / 2 g  Avoid: Chicken, with skin, light meat (3 oz/85 g) / 2.5 g  Choose: Chicken, skin removed, light meat (3 oz/85 g) / 1 g Dairy / Saturated Fat (g)  Avoid: Whole milk (1 cup) / 5 g  Choose: Low-fat milk, 2% (1 cup) / 3 g  Choose: Low-fat milk, 1% (1 cup) / 1.5 g  Choose: Skim milk (1 cup) / 0.3 g  Avoid: Hard cheese (1 oz/28 g) / 6 g  Choose: Skim milk cheese (1 oz/28 g) / 2 to 3 g  Avoid: Cottage cheese, 4% fat (1 cup) / 6.5 g  Choose: Low-fat cottage cheese, 1% fat (1 cup) / 1.5 g  Avoid: Ice cream (1 cup) / 9 g  Choose: Sherbet (1 cup) / 2.5 g  Choose: Nonfat frozen yogurt (1 cup) / 0.3 g  Choose: Frozen fruit bar / trace  Avoid: Whipped cream (1 tbs) / 3.5 g  Choose: Nondairy whipped topping (1 tbs) / 1 g Condiments / Saturated Fat (g)  Avoid: Mayonnaise (1 tbs) / 2 g  Choose: Low-fat mayonnaise (1 tbs) / 1 g  Avoid: Butter (1 tbs) / 7 g  Choose: Extra light margarine (1 tbs) / 1 g  Avoid: Coconut oil (1 tbs) / 11.8 g  Choose: Olive oil (1 tbs) / 1.8 g  Choose: Corn oil (1 tbs) / 1.7 g  Choose: Safflower oil (1 tbs) / 1.2 g  Choose: Sunflower oil (1 tbs) / 1.4 g  Choose: Soybean oil (1 tbs) / 2.4 g  Choose: Canola oil (1 tbs) / 1 g Document Released: 12/01/2005 Document Revised: 03/28/2013 Document Reviewed: 03/01/2014 ExitCare Patient Information 2015 [place], [place]. This information is not intended to replace advice given to you by your health care provider. Make sure you discuss any questions you have with your health care provider. Fat and Cholesterol Control Diet Fat and cholesterol levels in your blood and organs are  influenced by your diet. High levels of fat and cholesterol may lead to diseases of the heart, small and large blood vessels, gallbladder, liver, and pancreas. CONTROLLING FAT AND CHOLESTEROL WITH DIET Although exercise and lifestyle factors are important, your diet is key. That is because certain foods are known to raise cholesterol and others to lower it. The goal is to balance foods for their effect on cholesterol and more importantly, to replace saturated and trans fat with other types of fat, such as monounsaturated fat, polyunsaturated fat, and omega-3 fatty acids. On average, a person should consume no more than 15 to 17 g of saturated fat daily. Saturated and trans fats are considered "bad" fats, and they will raise LDL cholesterol. Saturated fats are primarily found in animal products such as meats, butter, and cream. However, that does not mean  you need to give up all your favorite foods. Today, there are good tasting, low-fat, low-cholesterol substitutes for most of the things you like to eat. Choose low-fat or nonfat alternatives. Choose round or loin cuts of red meat. These types of cuts are lowest in fat and cholesterol. Chicken (without the skin), fish, veal, and ground [place] breast are great choices. Eliminate fatty meats, such as hot dogs and salami. Even shellfish have little or no saturated fat. Have a 3 oz (85 g) portion when you eat lean meat, poultry, or fish. Trans fats are also called "partially hydrogenated oils." They are oils that have been scientifically manipulated so that they are solid at room temperature resulting in a longer shelf life and improved taste and texture of foods in which they are added. Trans fats are found in stick margarine, some tub margarines, cookies, crackers, and baked goods.  When baking and cooking, oils are a great substitute for butter. The monounsaturated oils are especially beneficial since it is believed they lower LDL and raise HDL. The oils you  should avoid entirely are saturated tropical oils, such as coconut and palm.  Remember to eat a lot from food groups that are naturally free of saturated and trans fat, including fish, fruit, vegetables, beans, grains (barley, rice, couscous, bulgur wheat), and pasta (without cream sauces).  IDENTIFYING FOODS THAT LOWER FAT AND CHOLESTEROL  Soluble fiber may lower your cholesterol. This type of fiber is found in fruits such as apples, vegetables such as broccoli, potatoes, and carrots, legumes such as beans, peas, and lentils, and grains such as barley. Foods fortified with plant sterols (phytosterol) may also lower cholesterol. You should eat at least 2 g per day of these foods for a cholesterol lowering effect.  Read package labels to identify low-saturated fats, trans fat free, and low-fat foods at the supermarket. Select cheeses that have only 2 to 3 g saturated fat per ounce. Use a heart-healthy tub margarine that is free of trans fats or partially hydrogenated oil. When buying baked goods (cookies, crackers), avoid partially hydrogenated oils. Breads and muffins should be made from whole grains (whole-wheat or whole oat flour, instead of "flour" or "enriched flour"). Buy non-creamy canned soups with reduced salt and no added fats.  FOOD PREPARATION TECHNIQUES  Never deep-fry. If you must fry, either stir-fry, which uses very little fat, or use non-stick cooking sprays. When possible, broil, bake, or roast meats, and steam vegetables. Instead of putting butter or margarine on vegetables, use lemon and herbs, applesauce, and cinnamon (for squash and sweet potatoes). Use nonfat yogurt, salsa, and low-fat dressings for salads.  LOW-SATURATED FAT / LOW-FAT FOOD SUBSTITUTES Meats / Saturated Fat (g)  Avoid: Steak, marbled (3 oz/85 g) / 11 g  Choose: Steak, lean (3 oz/85 g) / 4 g  Avoid: Hamburger (3 oz/85 g) / 7 g  Choose: Hamburger, lean (3 oz/85 g) / 5 g  Avoid: Ham (3 oz/85 g) / 6 g  Choose:  Ham, lean cut (3 oz/85 g) / 2.4 g  Avoid: Chicken, with skin, dark meat (3 oz/85 g) / 4 g  Choose: Chicken, skin removed, dark meat (3 oz/85 g) / 2 g  Avoid: Chicken, with skin, light meat (3 oz/85 g) / 2.5 g  Choose: Chicken, skin removed, light meat (3 oz/85 g) / 1 g Dairy / Saturated Fat (g)  Avoid: Whole milk (1 cup) / 5 g  Choose: Low-fat milk, 2% (1 cup) / 3 g  Choose: Low-fat  milk, 1% (1 cup) / 1.5 g  Choose: Skim milk (1 cup) / 0.3 g  Avoid: Hard cheese (1 oz/28 g) / 6 g  Choose: Skim milk cheese (1 oz/28 g) / 2 to 3 g  Avoid: Cottage cheese, 4% fat (1 cup) / 6.5 g  Choose: Low-fat cottage cheese, 1% fat (1 cup) / 1.5 g  Avoid: Ice cream (1 cup) / 9 g  Choose: Sherbet (1 cup) / 2.5 g  Choose: Nonfat frozen yogurt (1 cup) / 0.3 g  Choose: Frozen fruit bar / trace  Avoid: Whipped cream (1 tbs) / 3.5 g  Choose: Nondairy whipped topping (1 tbs) / 1 g Condiments / Saturated Fat (g)  Avoid: Mayonnaise (1 tbs) / 2 g  Choose: Low-fat mayonnaise (1 tbs) / 1 g  Avoid: Butter (1 tbs) / 7 g  Choose: Extra light margarine (1 tbs) / 1 g  Avoid: Coconut oil (1 tbs) / 11.8 g  Choose: Olive oil (1 tbs) / 1.8 g  Choose: Corn oil (1 tbs) / 1.7 g  Choose: Safflower oil (1 tbs) / 1.2 g  Choose: Sunflower oil (1 tbs) / 1.4 g  Choose: Soybean oil (1 tbs) / 2.4 g  Choose: Canola oil (1 tbs) / 1 g Document Released: 12/01/2005 Document Revised: 03/28/2013 Document Reviewed: 03/01/2014 ExitCare Patient Information 2015 [place], [place]. This information is not intended to replace advice given to you by your health care provider. Make sure you discuss any questions you have with your health care provider. Fat and Cholesterol Control Diet Fat and cholesterol levels in your blood and organs are influenced by your diet. High levels of fat and cholesterol may lead to diseases of the heart, small and large blood vessels, gallbladder, liver, and pancreas. CONTROLLING FAT AND  CHOLESTEROL WITH DIET Although exercise and lifestyle factors are important, your diet is key. That is because certain foods are known to raise cholesterol and others to lower it. The goal is to balance foods for their effect on cholesterol and more importantly, to replace saturated and trans fat with other types of fat, such as monounsaturated fat, polyunsaturated fat, and omega-3 fatty acids. On average, a person should consume no more than 15 to 17 g of saturated fat daily. Saturated and trans fats are considered "bad" fats, and they will raise LDL cholesterol. Saturated fats are primarily found in animal products such as meats, butter, and cream. However, that does not mean you need to give up all your favorite foods. Today, there are good tasting, low-fat, low-cholesterol substitutes for most of the things you like to eat. Choose low-fat or nonfat alternatives. Choose round or loin cuts of red meat. These types of cuts are lowest in fat and cholesterol. Chicken (without the skin), fish, veal, and ground [place] breast are great choices. Eliminate fatty meats, such as hot dogs and salami. Even shellfish have little or no saturated fat. Have a 3 oz (85 g) portion when you eat lean meat, poultry, or fish. Trans fats are also called "partially hydrogenated oils." They are oils that have been scientifically manipulated so that they are solid at room temperature resulting in a longer shelf life and improved taste and texture of foods in which they are added. Trans fats are found in stick margarine, some tub margarines, cookies, crackers, and baked goods.  When baking and cooking, oils are a great substitute for butter. The monounsaturated oils are especially beneficial since it is believed they lower LDL and raise HDL.  The oils you should avoid entirely are saturated tropical oils, such as coconut and palm.  Remember to eat a lot from food groups that are naturally free of saturated and trans fat, including fish,  fruit, vegetables, beans, grains (barley, rice, couscous, bulgur wheat), and pasta (without cream sauces).  IDENTIFYING FOODS THAT LOWER FAT AND CHOLESTEROL  Soluble fiber may lower your cholesterol. This type of fiber is found in fruits such as apples, vegetables such as broccoli, potatoes, and carrots, legumes such as beans, peas, and lentils, and grains such as barley. Foods fortified with plant sterols (phytosterol) may also lower cholesterol. You should eat at least 2 g per day of these foods for a cholesterol lowering effect.  Read package labels to identify low-saturated fats, trans fat free, and low-fat foods at the supermarket. Select cheeses that have only 2 to 3 g saturated fat per ounce. Use a heart-healthy tub margarine that is free of trans fats or partially hydrogenated oil. When buying baked goods (cookies, crackers), avoid partially hydrogenated oils. Breads and muffins should be made from whole grains (whole-wheat or whole oat flour, instead of "flour" or "enriched flour"). Buy non-creamy canned soups with reduced salt and no added fats.  FOOD PREPARATION TECHNIQUES  Never deep-fry. If you must fry, either stir-fry, which uses very little fat, or use non-stick cooking sprays. When possible, broil, bake, or roast meats, and steam vegetables. Instead of putting butter or margarine on vegetables, use lemon and herbs, applesauce, and cinnamon (for squash and sweet potatoes). Use nonfat yogurt, salsa, and low-fat dressings for salads.  LOW-SATURATED FAT / LOW-FAT FOOD SUBSTITUTES Meats / Saturated Fat (g)  Avoid: Steak, marbled (3 oz/85 g) / 11 g  Choose: Steak, lean (3 oz/85 g) / 4 g  Avoid: Hamburger (3 oz/85 g) / 7 g  Choose: Hamburger, lean (3 oz/85 g) / 5 g  Avoid: Ham (3 oz/85 g) / 6 g  Choose: Ham, lean cut (3 oz/85 g) / 2.4 g  Avoid: Chicken, with skin, dark meat (3 oz/85 g) / 4 g  Choose: Chicken, skin removed, dark meat (3 oz/85 g) / 2 g  Avoid: Chicken, with skin,  light meat (3 oz/85 g) / 2.5 g  Choose: Chicken, skin removed, light meat (3 oz/85 g) / 1 g Dairy / Saturated Fat (g)  Avoid: Whole milk (1 cup) / 5 g  Choose: Low-fat milk, 2% (1 cup) / 3 g  Choose: Low-fat milk, 1% (1 cup) / 1.5 g  Choose: Skim milk (1 cup) / 0.3 g  Avoid: Hard cheese (1 oz/28 g) / 6 g  Choose: Skim milk cheese (1 oz/28 g) / 2 to 3 g  Avoid: Cottage cheese, 4% fat (1 cup) / 6.5 g  Choose: Low-fat cottage cheese, 1% fat (1 cup) / 1.5 g  Avoid: Ice cream (1 cup) / 9 g  Choose: Sherbet (1 cup) / 2.5 g  Choose: Nonfat frozen yogurt (1 cup) / 0.3 g  Choose: Frozen fruit bar / trace  Avoid: Whipped cream (1 tbs) / 3.5 g  Choose: Nondairy whipped topping (1 tbs) / 1 g Condiments / Saturated Fat (g)  Avoid: Mayonnaise (1 tbs) / 2 g  Choose: Low-fat mayonnaise (1 tbs) / 1 g  Avoid: Butter (1 tbs) / 7 g  Choose: Extra light margarine (1 tbs) / 1 g  Avoid: Coconut oil (1 tbs) / 11.8 g  Choose: Olive oil (1 tbs) / 1.8 g  Choose: Corn oil (1 tbs) /  1.7 g  Choose: Safflower oil (1 tbs) / 1.2 g  Choose: Sunflower oil (1 tbs) / 1.4 g  Choose: Soybean oil (1 tbs) / 2.4 g  Choose: Canola oil (1 tbs) / 1 g Document Released: 12/01/2005 Document Revised: 03/28/2013 Document Reviewed: 03/01/2014 Exit[employer] Patient Information 2015 [place]. This information is not intended to replace advice given to you by your health care provider. Make sure you discuss any questions you have with your health care provider.

## 2014-07-17 NOTE — Assessment & Plan Note (Signed)
Diet has been fair  Pt aims to improve this as part of a wt loss effort  Lab today Handout -diet info Disc goals for chol and risks of high triglycerides On fenofibrate w/o problems

## 2014-07-17 NOTE — Progress Notes (Signed)
Subjective:    Patient ID: Adam Weber, male    DOB: 04/10/1976, 38 y.o.   MRN: [id number]  HPI Here for f/u of chronic health problems   Was able to go to the beach- good summer  Trying to take good care of himself    Wt is stable with bmi of 31 Wants to loose weight  He would like to get to 200 lb - he has lost weight before (down to 175)- by working out at the gym and watching calories  Needs to make time for himself   Hypothyroidism No clinical changes re: skin Thea Alken/hair  ? If a little sluggish - but thinks that is from lack of exercise  Lab Results  Component Value Date   TSH 2.96 05/17/2013     Hypertriglyceridemia On fenofibrate 54 mg daily Diet- average - aims for more fruit and vegetable - but occ junk / pizza / hamberger  Last check 4/14 from other office - trig 138 and LDL 114 Just had a beach week - expects it to be higher  Patient Active Problem List   Diagnosis Date Noted  . Seasonal allergies 06/01/2013  . 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 Unspecified hypothyroidism 05/17/2013   Past Medical History  Diagnosis Date  . History of chicken pox   . History of hyperlipidemia    Past Surgical History  Procedure Laterality Date  . Hand surgery  1997  . Appendectomy  2011   History  Substance Use Topics  . Smoking status: Never Smoker   . Smokeless tobacco: Not on file  . Alcohol Use: Yes     Comment: occ   Family History  Problem Relation Age of Onset  . Cancer - Lung Paternal Grandfather   . Hyperlipidemia Maternal Grandmother   . Stroke Maternal Grandmother   . Hypertension Maternal Grandmother   . Diabetes Paternal Grandmother     well controlled with good diet   No Known Allergies No current outpatient prescriptions on file prior to visit.   No current facility-administered medications on file prior to visit.    Review of Systems    Review of Systems  Constitutional: Negative for fever, appetite change, fatigue and unexpected weight  change.  Eyes: Negative for pain and visual disturbance.  Respiratory: Negative for cough and shortness of breath.   Cardiovascular: Negative for cp or palpitations    Gastrointestinal: Negative for nausea, diarrhea and constipation.  Genitourinary: Negative for urgency and frequency.  Skin: Negative for pallor or rash   Neurological: Negative for weakness, light-headedness, numbness and headaches.  Hematological: Negative for adenopathy. Does not bruise/bleed easily.  Psychiatric/Behavioral: Negative for dysphoric mood. The patient is not nervous/anxious.      Objective:   Physical Exam  Constitutional: He appears well-developed and well-nourished. No distress.  HENT:  Head: Normocephalic and atraumatic.  Nose: Nose normal.  Mouth/Throat: Oropharynx is clear and moist.  Nares are boggy  Eyes: Conjunctivae and EOM are normal. Pupils are equal, round, and reactive to light. Right eye exhibits no discharge. Left eye exhibits no discharge. No scleral icterus.  Neck: Normal range of motion. Neck supple. No JVD present. Carotid bruit is not present. No thyromegaly present.  Cardiovascular: Normal rate, regular rhythm, normal heart sounds and intact distal pulses.  Exam reveals no gallop.   Pulmonary/Chest: Effort normal and breath sounds normal. No respiratory distress. He has no wheezes. He exhibits no tenderness.  Abdominal: He exhibits no abdominal bruit.  Musculoskeletal: He exhibits no edema.  Lymphadenopathy:    He has no cervical adenopathy.  Neurological: He is alert. He has normal reflexes. No cranial nerve deficit. He exhibits normal muscle tone. Coordination normal.  No tremor   Skin: Skin is warm and dry. No rash noted. No erythema. No pallor.  Psychiatric: He has a normal mood and affect.          Assessment & Plan:   Problem List Items Addressed This Visit     Endocrine   Unspecified hypothyroidism - Primary     Hypothyroidism  Pt has no clinical changes No  change in energy level/ hair or skin/ edema and no tremor Lab today      Relevant Medications      levothyroxine (SYNTHROID, LEVOTHROID) tablet   Other Relevant Orders      TSH (Completed)     Other   Hypertriglyceridemia     Diet has been fair  Pt aims to improve this as part of a wt loss effort  Lab today Handout -diet info Disc goals for chol and risks of high triglycerides On fenofibrate w/o problems     Relevant Medications      fenofibrate tablet   Other Relevant Orders      Lipid panel (Completed)      Comprehensive metabolic panel (Completed)

## 2014-07-17 NOTE — Assessment & Plan Note (Signed)
Hypothyroidism  Pt has no clinical changes No change in energy level/ hair or skin/ edema and no tremor Lab today  

## 2014-07-17 NOTE — Progress Notes (Signed)
Pre visit review using our clinic review tool, if applicable. No additional management support is needed unless otherwise documented below in the visit note. 

## 2014-07-18 ENCOUNTER — Encounter: Payer: Self-pay | Admitting: *Deleted

## 2014-10-10 ENCOUNTER — Telehealth: Payer: Self-pay | Admitting: Family Medicine

## 2014-10-10 NOTE — Telephone Encounter (Signed)
Left voicemail letting pt know he would need OV we can't treat over

## 2014-10-10 NOTE — Telephone Encounter (Signed)
That cannot be treated over the phone -needs to be seen

## 2014-10-10 NOTE — Telephone Encounter (Signed)
Patient Information:  Caller Name: [name]  Phone: [phone]  Patient: Adam Weber, Adam Weber  Gender: Male  DOB: 08/16/1976  Age: 38 Years  [facility]P: [facility] [place] ([facility])  Office Follow Up:  Does the office need to follow up with this patient?: Yes  Instructions For The Office: Please advise this pt. if he can be prescribed an antibiotic or if he needs to come in for an appt.  RN Note:  Pt. is asking to be treated for sinusitis that he has had before. Used Augmentin 875mg .BID x 10 days ([facility]).Accessed EMR and no appts. Please advise this pt. if you prefer to see in office or if you can call in the antibiotic.  Symptoms  Reason For Call & Symptoms: Pt. c/o sinus infection with green drainage.No fever. Has a hx of this. C/o facial pressure and headache. Not sleeping. Would like to have antibiotics called in. Was on Augmentin 875mg . before and did will on it.  Reviewed Health History In EMR: Yes  Reviewed Medications In EMR: Yes  Reviewed Allergies In EMR: Yes  Reviewed Surgeries / Procedures: Yes  Date of Onset of Symptoms: 10/06/2014  Guideline(s) Used:  Colds  Disposition Per Guideline:   See Today or Tomorrow in Office  Reason For Disposition Reached:   Using nasal washes and pain medicine > 24 hours and sinus pain (lower forehead, cheekbone, or eye) persists  Advice Given:  Call Back If:  Difficulty breathing occurs  Nasal discharge lasts more than 10 days  You become worse  Patient Will Follow Care Advice:  YES

## 2014-10-12 ENCOUNTER — Encounter: Payer: Self-pay | Admitting: Internal Medicine

## 2014-10-12 ENCOUNTER — Ambulatory Visit (INDEPENDENT_AMBULATORY_CARE_PROVIDER_SITE_OTHER): Payer: BC Managed Care – PPO | Admitting: Internal Medicine

## 2014-10-12 VITALS — BP 122/80 | HR 64 | Temp 98.4°F | Wt 228.0 lb

## 2014-10-12 DIAGNOSIS — J018 Other acute sinusitis: Secondary | ICD-10-CM

## 2014-10-12 DIAGNOSIS — J309 Allergic rhinitis, unspecified: Secondary | ICD-10-CM

## 2014-10-12 MED ORDER — METHYLPREDNISOLONE ACETATE 80 MG/ML IJ SUSP
80.0000 mg | Freq: Once | INTRAMUSCULAR | Status: AC
  Administered 2014-10-12: 80 mg via INTRAMUSCULAR

## 2014-10-12 MED ORDER — AMOXICILLIN-POT CLAVULANATE 875-125 MG PO TABS: 1.0000 | ORAL_TABLET | Freq: Two times a day (BID) | ORAL | Status: DC

## 2014-10-12 NOTE — Patient Instructions (Addendum)

## 2014-10-12 NOTE — Progress Notes (Signed)
HPI  Pt presents to the clinic today with c/o facial pain and pressure, scratchy throat and ear fullness. He reports this started 1 week ago. He is blowing green mucous from his nose. He denies fever or chills. He has trid OTC sinus medication and Dayquil without much relief. He does have a history of seasonal allergies. He has tried flonase in the past but reports it is not effective. He has not had sick contacts that he is aware of. He is going out of town next week and does not want to end up in urgent care with a sinus infection.  Review of Systems    Past Medical History  Diagnosis Date  . History of chicken pox   . History of hyperlipidemia     Family History  Problem Relation Age of Onset  . Cancer - Lung Paternal Grandfather   . Hyperlipidemia Maternal Grandmother   . Stroke Maternal Grandmother   . Hypertension Maternal Grandmother   . Diabetes Paternal Grandmother     well controlled with good diet    History   Social History  . Marital Status: Married    Spouse Name: N/A    Number of Children: N/A  . Years of Education: N/A   Occupational History  . Not on file.   Social History Main Topics  . Smoking status: Never Smoker   . Smokeless tobacco: Not on file  . Alcohol Use: Yes     Comment: occ  . Drug Use: No  . Sexual Activity: Not on file   Other Topics Concern  . Not on file   Social History Narrative  . No narrative on file    No Known Allergies   Constitutional: Positive headache. Denies fatigue, fever or abrupt weight changes.  HEENT:  Positive  facial pain, nasal congestion and scratchy throat. Denies eye redness, ear pain, ringing in the ears, wax buildup, runny nose or bloody nose. Respiratory:  Denies cough, difficulty breathing or shortness of breath.  Cardiovascular: Denies chest pain, chest tightness, palpitations or swelling in the hands or feet.   No other specific complaints in a complete review of systems (except as listed in HPI  above).  Objective:   BP 122/80  Pulse 64  Temp(Src) 98.4 F (36.9 C) (Oral)  Wt 228 lb (103.42 kg)  SpO2 98%  General: Appears his stated age, well developed, well nourished in NAD. HEENT: Head: normal shape and size, mild maxillary sinus tenderness noted; Ears: Tm's gray and intact, normal light reflex; Nose: mucosa erythematous and moist, septum midline; Throat/Mouth: + PND. Teeth present, mucosa pink and moist, no exudate noted, no lesions or ulcerations noted.  Cardiovascular: Normal rate and rhythm. S1,S2 noted.  No murmur, rubs or gallops noted. Pulmonary/Chest: Normal effort and positive vesicular breath sounds. No respiratory distress. No wheezes, rales or ronchi noted.      Assessment & Plan:   Allergic Rhinitis:  Can use a Neti Pot which can be purchased from your local drug store. 80 mg Depo IM today Will give printed RX for Augmentin to take with him if symptoms worsen while he is out of town- advised him not to fill this before then Try zyrtec OTC daily  RTC as needed or if symptoms persist.

## 2014-10-12 NOTE — Progress Notes (Signed)
Pre visit review using our clinic review tool, if applicable. No additional management support is needed unless otherwise documented below in the visit note. 

## 2014-12-13 ENCOUNTER — Encounter (HOSPITAL_BASED_OUTPATIENT_CLINIC_OR_DEPARTMENT_OTHER): Payer: Self-pay | Admitting: *Deleted

## 2014-12-13 ENCOUNTER — Ambulatory Visit: Payer: BC Managed Care – PPO | Admitting: Physician Assistant

## 2014-12-13 ENCOUNTER — Emergency Department (HOSPITAL_BASED_OUTPATIENT_CLINIC_OR_DEPARTMENT_OTHER): Payer: BC Managed Care – PPO

## 2014-12-13 ENCOUNTER — Emergency Department (HOSPITAL_BASED_OUTPATIENT_CLINIC_OR_DEPARTMENT_OTHER)
Admission: EM | Admit: 2014-12-13 | Discharge: 2014-12-13 | Disposition: A | Discharge status: Home / Self Care | Payer: BC Managed Care – PPO | Attending: Emergency Medicine | Admitting: Emergency Medicine

## 2014-12-13 DIAGNOSIS — R51 Headache: Secondary | ICD-10-CM | POA: Diagnosis not present

## 2014-12-13 DIAGNOSIS — R519 Headache, unspecified: Secondary | ICD-10-CM

## 2014-12-13 DIAGNOSIS — Z8619 Personal history of other infectious and parasitic diseases: Secondary | ICD-10-CM | POA: Insufficient documentation

## 2014-12-13 DIAGNOSIS — R0789 Other chest pain: Secondary | ICD-10-CM

## 2014-12-13 DIAGNOSIS — Z792 Long term (current) use of antibiotics: Secondary | ICD-10-CM | POA: Insufficient documentation

## 2014-12-13 DIAGNOSIS — Z79899 Other long term (current) drug therapy: Secondary | ICD-10-CM | POA: Diagnosis not present

## 2014-12-13 DIAGNOSIS — E079 Disorder of thyroid, unspecified: Secondary | ICD-10-CM | POA: Insufficient documentation

## 2014-12-13 DIAGNOSIS — R202 Paresthesia of skin: Secondary | ICD-10-CM | POA: Diagnosis present

## 2014-12-13 HISTORY — DX: Disorder of thyroid, unspecified: E07.9

## 2014-12-13 LAB — CBC WITH DIFFERENTIAL/PLATELET
BASOS ABS: 0 10*3/uL (ref 0.0–0.1)
BASOS PCT: 1 % (ref 0–1)
EOS PCT: 3 % (ref 0–5)
Eosinophils Absolute: 0.2 10*3/uL (ref 0.0–0.7)
HCT: 43.5 % (ref 39.0–52.0)
Hemoglobin: 15.5 g/dL (ref 13.0–17.0)
LYMPHS PCT: 33 % (ref 12–46)
Lymphs Abs: 2.4 10*3/uL (ref 0.7–4.0)
MCH: 30 pg (ref 26.0–34.0)
MCHC: 35.6 g/dL (ref 30.0–36.0)
MCV: 84.3 fL (ref 78.0–100.0)
Monocytes Absolute: 0.6 10*3/uL (ref 0.1–1.0)
Monocytes Relative: 9 % (ref 3–12)
NEUTROS ABS: 4.1 10*3/uL (ref 1.7–7.7)
Neutrophils Relative %: 54 % (ref 43–77)
PLATELETS: 245 10*3/uL (ref 150–400)
RBC: 5.16 MIL/uL (ref 4.22–5.81)
RDW: 12.6 % (ref 11.5–15.5)
WBC: 7.4 10*3/uL (ref 4.0–10.5)

## 2014-12-13 LAB — TROPONIN I

## 2014-12-13 LAB — BASIC METABOLIC PANEL
ANION GAP: 5 (ref 5–15)
BUN: 12 mg/dL (ref 6–23)
CALCIUM: 9.1 mg/dL (ref 8.4–10.5)
CO2: 25 mmol/L (ref 19–32)
Chloride: 107 mEq/L (ref 96–112)
Creatinine, Ser: 0.94 mg/dL (ref 0.50–1.35)
Glucose, Bld: 92 mg/dL (ref 70–99)
Potassium: 3.8 mmol/L (ref 3.5–5.1)
SODIUM: 137 mmol/L (ref 135–145)

## 2014-12-13 NOTE — ED Provider Notes (Signed)
CSN: [id number]     Arrival date & time 12/13/14  1323 History   First MD Initiated Contact with Patient 12/13/14 1344     Chief Complaint  Patient presents with  . Tingling    Adam Weber is a [age] y.o. right hand dominant male with a history of hyperlipidemia and hypothyroidism who presents the emergency department complaining of left hand tingling, slight left sided headache and some chest discomfort since last night. The patient reports that he was sitting down on a couch last night after putting his children to bed when he noticed he had a left sided headache that he rated a 6 out of 10 and some left hand tingling. He also reported some left chest discomfort that is worse with movement and deep inspiration. Patient reports he's had some more heartburn recently over the past few weeks and when his symptoms started he took some Tums with some relief. He reports taking his blood pressure with symptom onset and noted to be higher for him at 150/90. He reports feeling more anxious last night after symptom onset. Patient reports he woke up this morning with the same left-sided headache and took some Aleve with some relief. Patient reports that currently his headache is at 2 out of 10 and left-sided. He reports slight left hand numbness currently. He denies current chest pain. He reports family history of an Uncle with an MI at age 37. He denies personal history of MI. He denies personal or family history of CVA. He denies personal or family history of DVTs or PEs. He denies personal or family history of blood clotting disorders such as factor V leiden, protein C or S deficiency. He denies personal history of cancer.  (Consider location/radiation/quality/duration/timing/severity/associated sxs/prior Treatment) HPI  Past Medical History  Diagnosis Date  . History of chicken pox   . History of hyperlipidemia   . Thyroid disease    Past Surgical History  Procedure Laterality Date  . Hand surgery   1997  . Appendectomy  2011   Family History  Problem Relation Age of Onset  . Cancer - Lung Paternal Grandfather   . Hyperlipidemia Maternal Grandmother   . Stroke Maternal Grandmother   . Hypertension Maternal Grandmother   . Diabetes Paternal Grandmother     well controlled with good diet   History  Substance Use Topics  . Smoking status: Never Smoker   . Smokeless tobacco: Not on file  . Alcohol Use: Yes     Comment: occ    Review of Systems  Constitutional: Negative for fever and chills.  HENT: Positive for congestion. Negative for ear pain, sore throat and trouble swallowing.   Eyes: Negative for pain and visual disturbance.  Respiratory: Negative for cough, shortness of breath and wheezing.   Cardiovascular: Positive for chest pain. Negative for palpitations.  Gastrointestinal: Negative for nausea, vomiting, abdominal pain and diarrhea.  Genitourinary: Negative for dysuria and hematuria.  Musculoskeletal: Negative for back pain and neck pain.  Skin: Negative for rash.  Neurological: Positive for headaches. Negative for weakness, light-headedness and numbness.  All other systems reviewed and are negative.     Allergies  Review of patient's allergies indicates no known allergies.  Home Medications   Prior to Admission medications   Medication Sig Start Date End Date Taking? Authorizing Provider  fenofibrate 54 MG tablet Take 1 tablet (54 mg total) by mouth daily. 07/17/14  Yes Judy PimpleMarne A Tower, MD  levothyroxine (SYNTHROID, LEVOTHROID) 50 MCG tablet Take 1  tablet (50 mcg total) by mouth daily. 07/17/14  Yes Judy Pimple, MD  amoxicillin-clavulanate (AUGMENTIN) 875-125 MG per tablet Take 1 tablet by mouth 2 (two) times daily. 10/12/14   Lorre Munroe, NP   BP 132/76 mmHg  Pulse 55  Temp(Src) 98.2 F (36.8 C) (Oral)  Resp 18  Ht 6\' 1"  (1.854 m)  Wt 220 lb (99.791 kg)  BMI 29.03 kg/m2  SpO2 99% Physical Exam  Constitutional: He is oriented to person, place, and  time. He appears well-developed and well-nourished. No distress.  HENT:  Head: Normocephalic and atraumatic.  Right Ear: External ear normal.  Left Ear: External ear normal.  Nose: Nose normal.  Mouth/Throat: Oropharynx is clear and moist. No oropharyngeal exudate.  Bilateral tympanic membranes are pearly gray without erythema or loss of landmarks. No temporal tenderness or edema noted.  Eyes: Conjunctivae and EOM are normal. Pupils are equal, round, and reactive to light. Right eye exhibits no discharge. Left eye exhibits no discharge.  Neck: Normal range of motion. Neck supple.  Cardiovascular: Normal rate, regular rhythm, normal heart sounds and intact distal pulses.  Exam reveals no gallop and no friction rub.   No murmur heard. Bilateral radial pulses are intact. Bilateral posterior tibialis and dorsalis pedis pulses are intact.  Pulmonary/Chest: Effort normal and breath sounds normal. No respiratory distress. He has no wheezes. He has no rales. He exhibits no tenderness.  Abdominal: Soft. He exhibits no distension. There is no tenderness.  Musculoskeletal: He exhibits no edema.  Lymphadenopathy:    He has no cervical adenopathy.  Neurological: He is alert and oriented to person, place, and time. No cranial nerve deficit. Coordination normal.  Skin: Skin is warm and dry. No rash noted. He is not diaphoretic. No erythema. No pallor.  Psychiatric: He has a normal mood and affect. His behavior is normal.  Nursing note and vitals reviewed.   ED Course  Procedures (including critical care time) Labs Review Labs Reviewed  BASIC METABOLIC PANEL  CBC WITH DIFFERENTIAL  TROPONIN I    Imaging Review Dg Chest 2 View  12/13/2014   CLINICAL DATA:  Started last night with headache. Elevated blood pressure. Left arm is tingling. Nonsmoker.  EXAM: CHEST  2 VIEW  COMPARISON:  10/24/2008  FINDINGS: The heart size and mediastinal contours are within normal limits. Both lungs are clear. The  visualized skeletal structures are unremarkable.  IMPRESSION: No active cardiopulmonary disease.   Electronically Signed   By: Rosalie Gums M.D.   On: 12/13/2014 14:31     EKG Interpretation   Date/Time:  Wednesday December 13 2014 13:46:55 EST Ventricular Rate:  51 PR Interval:  144 QRS Duration: 92 QT Interval:  412 QTC Calculation: 379 R Axis:   60 Text Interpretation:  Sinus bradycardia Otherwise normal ECG No  significant change since last tracing except rate is slower Confirmed by  WARD,  DO, KRISTEN ([id number]) on 12/13/2014 1:54:29 PM      Filed Vitals:   12/13/14 1338 12/13/14 1536 12/13/14 1619  BP: 126/90 129/83 132/76  Pulse: 55 53 55  Temp: 98.6 F (37 C)  98.2 F (36.8 C)  TempSrc: Oral  Oral  Resp: 20 18 18   Height: 6\' 1"  (1.854 m)    Weight: 220 lb (99.791 kg)    SpO2: 100% 100% 99%     MDM   Meds given in ED:  Medications - No data to display  Discharge Medication List as of 12/13/2014  4:17 PM  Final diagnoses:  Atypical chest pain  Nonintractable headache, unspecified chronicity pattern, unspecified headache type   This patient is a 38 year old male with a history of hypothyroidism and hyperlipidemia who presented to the emergency department complaining of left arm tingling, slight headache and concerns for high blood pressure. Patient reports he had elevated blood pressure 150/90 at home last night. Patient also reports a 6 out of 10 headache last night. Patient works at similar headaches in the past. Patient also had some left-sided chest discomfort without shortness of breath or palpitations. The patient does report a family history of an uncle with MI in his 6040s. Patient denies personal or family history of DVTs or PEs. Patient denies personal history of coronary artery disease. Patient is afebrile and nontoxic-appearing. The patient is not tachypneic or tachycardic. Patient is not hypoxic. Patient has no lower extremity edema. The patient's BMP  and CBC is unremarkable. The patient's troponin is negative. Patient's chest x-ray is unremarkable. Patient is neurologically intact. I see no need for imaging of his head at this time. I feel it is okay to be discharged at this time. Advised patient to follow-up with his primary care provider next week. Strict return precautions were provided. Advised patient to return to the emergency department with new or worsening symptoms or new concerns. The patient verbalized understanding and agreement with plan.   This patient was discussed with and evaluated by Dr. Elesa MassedWard who agrees with assessment and plan.    Lawana ChambersWilliam Duncan Jarica Plass, PA-C 12/13/14 [phone]

## 2014-12-13 NOTE — Discharge Instructions (Signed)
Hypertension °Hypertension, commonly called high blood pressure, is when the force of blood pumping through your arteries is too strong. Your arteries are the blood vessels that carry blood from your heart throughout your body. A blood pressure reading consists of a higher number over a lower number, such as 110/72. The higher number (systolic) is the pressure inside your arteries when your heart pumps. The lower number (diastolic) is the pressure inside your arteries when your heart relaxes. Ideally you want your blood pressure below 120/80. °Hypertension forces your heart to work harder to pump blood. Your arteries may become narrow or stiff. Having hypertension puts you at risk for heart disease, stroke, and other problems.  °RISK FACTORS °Some risk factors for high blood pressure are controllable. Others are not.  °Risk factors you cannot control include:  °· Race. You may be at higher risk if you are African American. °· Age. Risk increases with age. °· Gender. Men are at higher risk than women before age 45 years. After age 65, women are at higher risk than men. °Risk factors you can control include: °· Not getting enough exercise or physical activity. °· Being overweight. °· Getting too much fat, sugar, calories, or salt in your diet. °· Drinking too much alcohol. °SIGNS AND SYMPTOMS °Hypertension does not usually cause signs or symptoms. Extremely high blood pressure (hypertensive crisis) may cause headache, anxiety, shortness of breath, and nosebleed. °DIAGNOSIS  °To check if you have hypertension, your health care provider will measure your blood pressure while you are seated, with your arm held at the level of your heart. It should be measured at least twice using the same arm. Certain conditions can cause a difference in blood pressure between your right and left arms. A blood pressure reading that is higher than normal on one occasion does not mean that you need treatment. If one blood pressure reading  is high, ask your health care provider about having it checked again. °TREATMENT  °Treating high blood pressure includes making lifestyle changes and possibly taking medicine. Living a healthy lifestyle can help lower high blood pressure. You may need to change some of your habits. °Lifestyle changes may include: °· Following the DASH diet. This diet is high in fruits, vegetables, and whole grains. It is low in salt, red meat, and added sugars. °· Getting at least 2½ hours of brisk physical activity every week. °· Losing weight if necessary. °· Not smoking. °· Limiting alcoholic beverages. °· Learning ways to reduce stress. ° If lifestyle changes are not enough to get your blood pressure under control, your health care provider may prescribe medicine. You may need to take more than one. Work closely with your health care provider to understand the risks and benefits. °HOME CARE INSTRUCTIONS °· Have your blood pressure rechecked as directed by your health care provider.   °· Take medicines only as directed by your health care provider. Follow the directions carefully. Blood pressure medicines must be taken as prescribed. The medicine does not work as well when you skip doses. Skipping doses also puts you at risk for problems.   °· Do not smoke.   °· Monitor your blood pressure at home as directed by your health care provider.  °SEEK MEDICAL CARE IF:  °· You think you are having a reaction to medicines taken. °· You have recurrent headaches or feel dizzy. °· You have swelling in your ankles. °· You have trouble with your vision. °SEEK IMMEDIATE MEDICAL CARE IF: °· You develop a severe headache or confusion. °·   You have unusual weakness, numbness, or feel faint. °· You have severe chest or abdominal pain. °· You vomit repeatedly. °· You have trouble breathing. °MAKE SURE YOU:  °· Understand these instructions. °· Will watch your condition. °· Will get help right away if you are not doing well or get worse. °Document  Released: 12/01/2005 Document Revised: 04/17/2014 Document Reviewed: 09/23/2013 °[employer]® Patient Information ©2015 [employer]. This information is not intended to replace advice given to you by your health care provider. Make sure you discuss any questions you have with your health care provider. °Chest Pain (Nonspecific) °It is often hard to give a specific diagnosis for the cause of chest pain. There is always a chance that your pain could be related to something serious, such as a heart attack or a blood clot in the lungs. You need to follow up with your health care provider for further evaluation. °CAUSES  °· Heartburn. °· Pneumonia or bronchitis. °· Anxiety or stress. °· Inflammation around your heart (pericarditis) or lung (pleuritis or pleurisy). °· A blood clot in the lung. °· A collapsed lung (pneumothorax). It can develop suddenly on its own (spontaneous pneumothorax) or from trauma to the chest. °· Shingles infection (herpes zoster virus). °The chest wall is composed of bones, muscles, and cartilage. Any of these can be the source of the pain. °· The bones can be bruised by injury. °· The muscles or cartilage can be strained by coughing or overwork. °· The cartilage can be affected by inflammation and become sore (costochondritis). °DIAGNOSIS  °Lab tests or other studies may be needed to find the cause of your pain. Your health care provider may have you take a test called an ambulatory electrocardiogram (ECG). An ECG records your heartbeat patterns over a 24-hour period. You may also have other tests, such as: °· Transthoracic echocardiogram (TTE). During echocardiography, sound waves are used to evaluate how blood flows through your heart. °· Transesophageal echocardiogram (TEE). °· Cardiac monitoring. This allows your health care provider to monitor your heart rate and rhythm in real time. °· Holter monitor. This is a portable device that records your heartbeat and can help diagnose heart  arrhythmias. It allows your health care provider to track your heart activity for several days, if needed. °· Stress tests by exercise or by giving medicine that makes the heart beat faster. °TREATMENT  °· Treatment depends on what may be causing your chest pain. Treatment may include: °¨ Acid blockers for heartburn. °¨ Anti-inflammatory medicine. °¨ Pain medicine for inflammatory conditions. °¨ Antibiotics if an infection is present. °· You may be advised to change lifestyle habits. This includes stopping smoking and avoiding alcohol, caffeine, and chocolate. °· You may be advised to keep your head raised (elevated) when sleeping. This reduces the chance of acid going backward from your stomach into your esophagus. °Most of the time, nonspecific chest pain will improve within 2-3 days with rest and mild pain medicine.  °HOME CARE INSTRUCTIONS  °· If antibiotics were prescribed, take them as directed. Finish them even if you start to feel better. °· For the next few days, avoid physical activities that bring on chest pain. Continue physical activities as directed. °· Do not use any tobacco products, including cigarettes, chewing tobacco, or electronic cigarettes. °· Avoid drinking alcohol. °· Only take medicine as directed by your health care provider. °· Follow your health care provider's suggestions for further testing if your chest pain does not go away. °· Keep any follow-up appointments you   made. If you do not go to an appointment, you could develop lasting (chronic) problems with pain. If there is any problem keeping an appointment, call to reschedule. °SEEK MEDICAL CARE IF:  °· Your chest pain does not go away, even after treatment. °· You have a rash with blisters on your chest. °· You have a fever. °SEEK IMMEDIATE MEDICAL CARE IF:  °· You have increased chest pain or pain that spreads to your arm, neck, jaw, back, or abdomen. °· You have shortness of breath. °· You have an increasing cough, or you cough up  blood. °· You have severe back or abdominal pain. °· You feel nauseous or vomit. °· You have severe weakness. °· You faint. °· You have chills. °This is an emergency. Do not wait to see if the pain will go away. Get medical help at once. Call your local emergency services (911 in [place] Do not drive yourself to the hospital. °MAKE SURE YOU:  °· Understand these instructions. °· Will watch your condition. °· Will get help right away if you are not doing well or get worse. °Document Released: 09/10/2005 Document Revised: 12/06/2013 Document Reviewed: 07/06/2008 °ExitCare® Patient Information ©2015 [employer]. This information is not intended to replace advice given to you by your health care provider. Make sure you discuss any questions you have with your health care provider. ° °

## 2014-12-13 NOTE — ED Notes (Signed)
Pt reports left arm tingling last night and slight headache- made appt with PCP for this afternoon but was called back by triage RN and directed to come to ED for eval-

## 2014-12-13 NOTE — ED Provider Notes (Signed)
Medical screening examination/treatment/procedure(s) were conducted as a shared visit with non-physician practitioner(s) and myself.  I personally evaluated the patient during the encounter.   EKG Interpretation   Date/Time:  Wednesday December 13 2014 13:46:55 EST Ventricular Rate:  51 PR Interval:  144 QRS Duration: 92 QT Interval:  412 QTC Calculation: 379 R Axis:   60 Text Interpretation:  Sinus bradycardia Otherwise normal ECG No  significant change since last tracing except rate is slower Confirmed by  WARD,  DO, KRISTEN ([id number]) on 12/13/2014 1:54:29 PM      Pt is a 38 y.o. male with history of hypothyroidism, hyperlipidemia who presents to the emergency department with concerns for hypertension. States last night he had a slight left-sided headache that he has had similar headaches in the past. He also had some tingling sensation in the fingertips of his left hand. He states that he is also found a "twinge" of left-sided chest pain that is worse when he takes a big deep breath and inexpensive chest wall and when he stretches his chest wall. Denies any shortness of breath, nausea, vomiting, diaphoresis or dizziness. No lower extremity swelling or pain. No prior history of PE or DVT. Does have a family history of an uncle who had an MI in his 8340s. Denies a history of CAD for himself. No prior history of stress test. States that he was concerned because he took his blood pressure today and it was in the 150s/90s which is higher for him. He called his PCPs office to be seen in the office and instructed to come to the emergency department. He states he currently feels very well. Exam is benign. He is neurologically intact. EKG shows no ischemic changes. He is PERC negative.  HEART score is 1.  Labs unremarkable clearing negative troponin. Chest x-ray clear. I do not feel he needs imaging of his head as I have low suspicion for intracranial abnormality including infarct or intracranial hemorrhage.  Low suspicion for ACS or pulmonary embolus. I feel he is safe to be discharged home. His blood pressure is now in the 120s/80s. He is comfortable with this plan. Discussed return precautions.   Layla MawKristen N Ward, DO 12/13/14 [phone]

## 2014-12-27 ENCOUNTER — Encounter: Payer: Self-pay | Admitting: Family Medicine

## 2014-12-27 ENCOUNTER — Ambulatory Visit (INDEPENDENT_AMBULATORY_CARE_PROVIDER_SITE_OTHER): Payer: BC Managed Care – PPO | Admitting: Family Medicine

## 2014-12-27 VITALS — BP 128/78 | HR 57 | Temp 97.9°F | Ht 72.25 in | Wt 234.2 lb

## 2014-12-27 DIAGNOSIS — R030 Elevated blood-pressure reading, without diagnosis of hypertension: Secondary | ICD-10-CM

## 2014-12-27 DIAGNOSIS — K589 Irritable bowel syndrome without diarrhea: Secondary | ICD-10-CM

## 2014-12-27 DIAGNOSIS — IMO0001 Reserved for inherently not codable concepts without codable children: Secondary | ICD-10-CM

## 2014-12-27 NOTE — Patient Instructions (Signed)
Bp is fine  Work on health diet and exercise  If you experience any more symptoms - let us know  ER notes and studies are re assuring   For IBS- get a fiber supplement (citrucel) - see if this helps a bit

## 2014-12-27 NOTE — Assessment & Plan Note (Signed)
Rev ED records from 12/30 - see HPI Symptoms are gone- no further cp or ha or tingling  Nl / reassuring exam  Rev labs  Will update if symptoms return BP Readings from Last 3 Encounters:  12/27/14 128/78  12/13/14 132/76  10/12/14 122/80    Will continue to watch this  Disc lifestyle change/ diet/exercise

## 2014-12-27 NOTE — Assessment & Plan Note (Signed)
Pt mentions today - frequent loose stools at times without trigger  Disc diet Recommend fiber supplement/fluids/more fruit and veg  Given handouts  Will update if worse or new symptoms or if no imp

## 2014-12-27 NOTE — Progress Notes (Signed)
Subjective:    Patient ID: Adam Weber, male    DOB: [birth year], 39 y.o.   MRN: [id number]  HPI Here for ED f/u of visit on 12/30 for chest discomfort and tingling of fingertips L hand and a headache  Pt states his wife wanted him to get checked out   He in retrospect thinks the chest discomfort and finger tips was muscular - feels fine now    He had bp 126/90 in ED   (per pt was 150s/90s at home)    Nl EKG with rate of 60 CXR nl  Nl labs Results for orders placed or performed during the hospital encounter of 12/13/14  Basic metabolic panel  Result Value Ref Range   Sodium 137 135 - 145 mmol/L   Potassium 3.8 3.5 - 5.1 mmol/L   Chloride 107 96 - 112 mEq/L   CO2 25 19 - 32 mmol/L   Glucose, Bld 92 70 - 99 mg/dL   BUN 12 6 - 23 mg/dL   Creatinine, Ser 4.09 0.50 - 1.35 mg/dL   Calcium 9.1 8.4 - 81.1 mg/dL   GFR calc non Af Amer >90 >90 mL/min   GFR calc Af Amer >90 >90 mL/min   Anion gap 5 5 - 15  CBC with Differential  Result Value Ref Range   WBC 7.4 4.0 - 10.5 K/uL   RBC 5.16 4.22 - 5.81 MIL/uL   Hemoglobin 15.5 13.0 - 17.0 g/dL   HCT 91.4 78.2 - 95.6 %   MCV 84.3 78.0 - 100.0 fL   MCH 30.0 26.0 - 34.0 pg   MCHC 35.6 30.0 - 36.0 g/dL   RDW 21.3 08.6 - 57.8 %   Platelets 245 150 - 400 K/uL   Neutrophils Relative % 54 43 - 77 %   Neutro Abs 4.1 1.7 - 7.7 K/uL   Lymphocytes Relative 33 12 - 46 %   Lymphs Abs 2.4 0.7 - 4.0 K/uL   Monocytes Relative 9 3 - 12 %   Monocytes Absolute 0.6 0.1 - 1.0 K/uL   Eosinophils Relative 3 0 - 5 %   Eosinophils Absolute 0.2 0.0 - 0.7 K/uL   Basophils Relative 1 0 - 1 %   Basophils Absolute 0.0 0.0 - 0.1 K/uL  Troponin I  Result Value Ref Range   Troponin I <0.03 <0.031 ng/mL    Incl neg troponins  Wt is up today ? 14 lb with bmi of 31  Per pt -his weight fluctuates  Thinks it was from holiday eating  Not exercising as "much as he wants to"    (he stays active with his kids)    Has felt fine now and ever since    Having issues with IBS  He has loose stool- esp in the am - and 30 min after meals    (2-3 times per day)  No pain - but bloated and his stomach makes a lot of noise  No blood in stool  No fam hx of colon probs - mother may have IBS   Patient Active Problem List   Diagnosis Date Noted  . Seasonal allergies 06/01/2013  . 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 Unspecified hypothyroidism 05/17/2013   Past Medical History  Diagnosis Date  . History of chicken pox   . History of hyperlipidemia   . Thyroid disease    Past Surgical History  Procedure Laterality Date  . Hand surgery  1997  . Appendectomy  2011   History  Substance  Use Topics  . Smoking status: Never Smoker   . Smokeless tobacco: Not on file  . Alcohol Use: 0.0 oz/week    0 Not specified per week     Comment: occ   Family History  Problem Relation Age of Onset  . Cancer - Lung Paternal Grandfather   . Hyperlipidemia Maternal Grandmother   . Stroke Maternal Grandmother   . Hypertension Maternal Grandmother   . Diabetes Paternal Grandmother     well controlled with good diet   No Known Allergies Current Outpatient Prescriptions on File Prior to Visit  Medication Sig Dispense Refill  . fenofibrate 54 MG tablet Take 1 tablet (54 mg total) by mouth daily. 30 tablet 11  . levothyroxine (SYNTHROID, LEVOTHROID) 50 MCG tablet Take 1 tablet (50 mcg total) by mouth daily. 30 tablet 11   No current facility-administered medications on file prior to visit.     Review of Systems Review of Systems  Constitutional: Negative for fever, appetite change, fatigue and unexpected weight change.  Eyes: Negative for pain and visual disturbance.  Respiratory: Negative for cough and shortness of breath.   Cardiovascular: Negative for cp or palpitations    Gastrointestinal: Negative for nausea, blood in stool or abd pain, pos for loose urgent stools  Genitourinary: Negative for urgency and frequency.  Skin: Negative for pallor  or rash   Neurological: Negative for weakness, light-headedness, numbness and headaches.  Hematological: Negative for adenopathy. Does not bruise/bleed easily.  Psychiatric/Behavioral: Negative for dysphoric mood. The patient is not nervous/anxious.         Objective:   Physical Exam  Constitutional: He appears well-developed and well-nourished. No distress.  HENT:  Head: Normocephalic and atraumatic.  Right Ear: External ear normal.  Left Ear: External ear normal.  Nose: Nose normal.  Mouth/Throat: Oropharynx is clear and moist.  Eyes: Conjunctivae and EOM are normal. Pupils are equal, round, and reactive to light. Right eye exhibits no discharge. Left eye exhibits no discharge. No scleral icterus.  Neck: Normal range of motion. Neck supple. No JVD present. Carotid bruit is not present. No thyromegaly present.  Cardiovascular: Normal rate, regular rhythm, normal heart sounds and intact distal pulses.  Exam reveals no gallop.   Pulmonary/Chest: Effort normal and breath sounds normal. No respiratory distress. He has no wheezes. He exhibits no tenderness.  Abdominal: Soft. Bowel sounds are normal. He exhibits no distension, no abdominal bruit and no mass. There is no tenderness. There is no rebound and no guarding.  Musculoskeletal: He exhibits no edema or tenderness.  No leg tenderness   Lymphadenopathy:    He has no cervical adenopathy.  Neurological: He is alert. He has normal reflexes. No cranial nerve deficit. He exhibits normal muscle tone. Coordination normal.  Skin: Skin is warm and dry. No rash noted. No erythema. No pallor.  Psychiatric: He has a normal mood and affect.          Assessment & Plan:   Problem List Items Addressed This Visit      Digestive   IBS (irritable bowel syndrome)    Pt mentions today - frequent loose stools at times without trigger  Disc diet Recommend fiber supplement/fluids/more fruit and veg  Given handouts  Will update if worse or new  symptoms or if no imp        Other   Elevated BP - Primary    Rev ED records from 12/30 - see HPI Symptoms are gone- no further cp or ha or tingling  Nl / reassuring exam  Rev labs  Will update if symptoms return BP Readings from Last 3 Encounters:  12/27/14 128/78  12/13/14 132/76  10/12/14 122/80    Will continue to watch this  Disc lifestyle change/ diet/exercise

## 2014-12-27 NOTE — Progress Notes (Signed)
Pre visit review using our clinic review tool, if applicable. No additional management support is needed unless otherwise documented below in the visit note. 

## 2015-07-27 ENCOUNTER — Other Ambulatory Visit: Payer: Self-pay | Admitting: Family Medicine

## 2015-10-05 ENCOUNTER — Other Ambulatory Visit: Payer: Self-pay | Admitting: Family Medicine

## 2015-11-09 ENCOUNTER — Other Ambulatory Visit: Payer: Self-pay | Admitting: Family Medicine

## 2015-11-23 ENCOUNTER — Encounter: Payer: Self-pay | Admitting: Family Medicine

## 2015-11-23 ENCOUNTER — Ambulatory Visit (INDEPENDENT_AMBULATORY_CARE_PROVIDER_SITE_OTHER): Payer: BC Managed Care – PPO | Admitting: Family Medicine

## 2015-11-23 VITALS — BP 136/82 | HR 57 | Temp 98.4°F | Ht 72.25 in | Wt 229.5 lb

## 2015-11-23 DIAGNOSIS — E669 Obesity, unspecified: Secondary | ICD-10-CM

## 2015-11-23 DIAGNOSIS — E781 Pure hyperglyceridemia: Secondary | ICD-10-CM

## 2015-11-23 DIAGNOSIS — E039 Hypothyroidism, unspecified: Secondary | ICD-10-CM

## 2015-11-23 LAB — COMPREHENSIVE METABOLIC PANEL
ALT: 15 U/L (ref 9–46)
AST: 17 U/L (ref 10–40)
Albumin: 4.5 g/dL (ref 3.6–5.1)
Alkaline Phosphatase: 58 U/L (ref 40–115)
BUN: 15 mg/dL (ref 7–25)
CALCIUM: 9.3 mg/dL (ref 8.6–10.3)
CO2: 28 mmol/L (ref 20–31)
Chloride: 105 mmol/L (ref 98–110)
Creat: 1.2 mg/dL (ref 0.60–1.35)
Glucose, Bld: 85 mg/dL (ref 65–99)
POTASSIUM: 4.7 mmol/L (ref 3.5–5.3)
Sodium: 142 mmol/L (ref 135–146)
Total Bilirubin: 0.5 mg/dL (ref 0.2–1.2)
Total Protein: 6.5 g/dL (ref 6.1–8.1)

## 2015-11-23 LAB — LIPID PANEL
CHOLESTEROL: 210 mg/dL — AB (ref 125–200)
HDL: 32 mg/dL — AB (ref >=40)
LDL Cholesterol: 139 mg/dL — ABNORMAL HIGH (ref <130)
TRIGLYCERIDES: 193 mg/dL — AB (ref <150)
Total CHOL/HDL Ratio: 6.6 Ratio — ABNORMAL HIGH (ref <=5.0)
VLDL: 39 mg/dL — AB (ref <30)

## 2015-11-23 LAB — TSH: TSH: 7.257 u[IU]/mL — ABNORMAL HIGH (ref 0.350–4.500)

## 2015-11-23 MED ORDER — LEVOTHYROXINE SODIUM 50 MCG PO TABS: 50.0000 ug | ORAL_TABLET | Freq: Every day | ORAL | Status: DC

## 2015-11-23 MED ORDER — FENOFIBRATE 54 MG PO TABS: 54.0000 mg | ORAL_TABLET | Freq: Every day | ORAL | Status: DC

## 2015-11-23 NOTE — Progress Notes (Signed)
Pre visit review using our clinic review tool, if applicable. No additional management support is needed unless otherwise documented below in the visit note. 

## 2015-11-23 NOTE — Progress Notes (Signed)
Subjective:    Patient ID: Adam Weber, male    DOB: 10/18/1976, 39 y.o.   MRN: [id number]  HPI Has been feeling good   Uneventful year  2 young children - 9 and 6   Wt is down 5 lb  bmi of 30 Trying to watch diet - trying to / convenience eating sometimes  21,000 steps per day on pedometer   Due for thyroid check  Hypothyroidism  Pt has no clinical changes No change in energy level/ hair or skin/ edema and no tremor Lab Results  Component Value Date   TSH 4.05 07/17/2014    Due for tsh today   Cholesterol  Fenofibrate - not skipping doses   Patient Active Problem List   Diagnosis Date Noted  . Elevated BP 12/27/2014  . IBS (irritable bowel syndrome) 12/27/2014  . Seasonal allergies 06/01/2013  . 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 Hypothyroidism 05/17/2013   Past Medical History  Diagnosis Date  . History of chicken pox   . History of hyperlipidemia   . Thyroid disease    Past Surgical History  Procedure Laterality Date  . Hand surgery  1997  . Appendectomy  2011   Social History  Substance Use Topics  . Smoking status: Never Smoker   . Smokeless tobacco: None  . Alcohol Use: 0.0 oz/week    0 Standard drinks or equivalent per week     Comment: occ   Family History  Problem Relation Age of Onset  . Cancer - Lung Paternal Grandfather   . Hyperlipidemia Maternal Grandmother   . Stroke Maternal Grandmother   . Hypertension Maternal Grandmother   . Diabetes Paternal Grandmother     well controlled with good diet   No Known Allergies Current Outpatient Prescriptions on File Prior to Visit  Medication Sig Dispense Refill  . fenofibrate 54 MG tablet TAKE 1 TABLET BY MOUTH DAILY. APPT WITH DR Milinda AntisWER REQUIRED BEFORE FUTURE REFILLS ARE GIVEN 30 tablet 0  . levothyroxine (SYNTHROID, LEVOTHROID) 50 MCG tablet TAKE 1 TABLET BY MOUTH DAILY. APPT WITH DR Milinda AntisWER REQUIRED BEFORE FUTURE REFILLS ARE GIVEN 30 tablet 0   No current facility-administered  medications on file prior to visit.      Review of Systems Review of Systems  Constitutional: Negative for fever, appetite change, fatigue and unexpected weight change.  Eyes: Negative for pain and visual disturbance.  Respiratory: Negative for cough and shortness of breath.   Cardiovascular: Negative for cp or palpitations    Gastrointestinal: Negative for nausea, diarrhea and constipation.  Genitourinary: Negative for urgency and frequency.  Skin: Negative for pallor or rash   MSK pos for occ knee pain  Neurological: Negative for weakness, light-headedness, numbness and headaches.  Hematological: Negative for adenopathy. Does not bruise/bleed easily.  Psychiatric/Behavioral: Negative for dysphoric mood. The patient is not nervous/anxious.         Objective:   Physical Exam  Constitutional: He appears well-developed and well-nourished. No distress.  obese and well appearing   HENT:  Head: Normocephalic and atraumatic.  Right Ear: External ear normal.  Left Ear: External ear normal.  Nose: Nose normal.  Mouth/Throat: Oropharynx is clear and moist.  Eyes: Conjunctivae and EOM are normal. Pupils are equal, round, and reactive to light. Right eye exhibits no discharge. Left eye exhibits no discharge. No scleral icterus.  Neck: Normal range of motion. Neck supple. No JVD present. Carotid bruit is not present. No thyromegaly present.  Cardiovascular: Normal rate, regular rhythm, normal heart  sounds and intact distal pulses.  Exam reveals no gallop.   Pulmonary/Chest: Effort normal and breath sounds normal. No respiratory distress. He has no wheezes. He exhibits no tenderness.  Abdominal: Soft. Bowel sounds are normal. He exhibits no distension, no abdominal bruit and no mass. There is no tenderness.  Musculoskeletal: He exhibits no edema or tenderness.  Lymphadenopathy:    He has no cervical adenopathy.  Neurological: He is alert. He has normal reflexes. No cranial nerve deficit. He  exhibits normal muscle tone. Coordination normal.  Skin: Skin is warm and dry. No rash noted. No erythema. No pallor.  Psychiatric: He has a normal mood and affect.          Assessment & Plan:   Problem List Items Addressed This Visit      Endocrine   Hypothyroidism - Primary    No clinical changes Due for labs  No change in exam       Relevant Medications   levothyroxine (SYNTHROID, LEVOTHROID) 50 MCG tablet   Other Relevant Orders   TSH (Completed)     Other   Hypertriglyceridemia    Due for labs on fenofibrate  Pt thinks his diet is fair overall (not as good around holidays)      Relevant Medications   fenofibrate 54 MG tablet   Other Relevant Orders   Comprehensive metabolic panel (Completed)   Lipid panel (Completed)   Obesity    Discussed how this problem influences overall health and the risks it imposes  Reviewed plan for weight loss with lower calorie diet (via better food choices and also portion control or program like weight watchers) and exercise building up to or more than 30 minutes 5 days per week including some aerobic activity

## 2015-11-23 NOTE — Patient Instructions (Signed)
Please consider weaning off of sweet tea  Stay active  Eat low fat for your triglycerides (Avoid red meat/ fried foods/ egg yolks/ fatty breakfast meats/ butter, cheese and high fat dairy/ and shellfish)  Lab today  Get a flu shot when you are ready

## 2015-11-25 DIAGNOSIS — E669 Obesity, unspecified: Secondary | ICD-10-CM | POA: Insufficient documentation

## 2015-11-25 NOTE — Assessment & Plan Note (Signed)
Discussed how this problem influences overall health and the risks it imposes  Reviewed plan for weight loss with lower calorie diet (via better food choices and also portion control or program like weight watchers) and exercise building up to or more than 30 minutes 5 days per week including some aerobic activity    

## 2015-11-25 NOTE — Assessment & Plan Note (Signed)
Due for labs on fenofibrate  Pt thinks his diet is fair overall (not as good around holidays)

## 2015-11-25 NOTE — Assessment & Plan Note (Signed)
No clinical changes Due for labs  No change in exam

## 2015-11-26 ENCOUNTER — Telehealth: Payer: Self-pay | Admitting: *Deleted

## 2015-11-26 MED ORDER — LEVOTHYROXINE SODIUM 25 MCG PO TABS: 25.0000 ug | ORAL_TABLET | Freq: Every day | ORAL | Status: DC

## 2015-11-26 NOTE — Telephone Encounter (Signed)
-----   Message from Judy PimpleMarne A Tower, MD sent at 11/25/2015  1:02 PM EST ----- Cholesterol is significantly high (trig of 193)  Avoid red meat/ fried foods/ egg yolks/ fatty breakfast meats/ butter, cheese and high fat dairy/ and shellfish   Also tsh is high - suspect hypothyroidism  Please call in levothyroxine 25 mcg 1 po QD #30 1 ref  F/u in about 2 mo with lab prior for thyroid and cholesterol

## 2015-11-26 NOTE — Telephone Encounter (Signed)
Rx sent to pharmacy and pt notified of labs and Dr. Royden Purlower's comments, pt will check his work schedule and call to schedule his 2 month f/u appt

## 2015-12-14 ENCOUNTER — Other Ambulatory Visit: Payer: Self-pay | Admitting: Family Medicine

## 2015-12-25 ENCOUNTER — Encounter: Payer: Self-pay | Admitting: Family Medicine

## 2015-12-25 ENCOUNTER — Ambulatory Visit (INDEPENDENT_AMBULATORY_CARE_PROVIDER_SITE_OTHER): Payer: BC Managed Care – PPO | Admitting: Family Medicine

## 2015-12-25 VITALS — BP 120/70 | HR 66 | Temp 98.0°F | Wt 231.0 lb

## 2015-12-25 DIAGNOSIS — J029 Acute pharyngitis, unspecified: Secondary | ICD-10-CM | POA: Insufficient documentation

## 2015-12-25 LAB — POCT RAPID STREP A (OFFICE): Rapid Strep A Screen: POSITIVE — AB

## 2015-12-25 MED ORDER — PENICILLIN V POTASSIUM 500 MG PO TABS: 500.0000 mg | ORAL_TABLET | Freq: Three times a day (TID) | ORAL | Status: DC

## 2015-12-25 NOTE — Progress Notes (Signed)
Pre visit review using our clinic review tool, if applicable. No additional management support is needed unless otherwise documented below in the visit note. 

## 2015-12-25 NOTE — Patient Instructions (Signed)

## 2015-12-25 NOTE — Progress Notes (Signed)
Subjective:   Patient ID: Adam Weber, male    DOB: [birth year], 40 y.o.   MRN: [id number]  Adam Weber is a pleasant 40 y.o. year old male pt of Dr. Milinda Antis, new to me, who presents to clinic today with Abdominal Pain  on 12/25/2015  HPI:  Felt nauseated last night, no vomiting.  Had some chills and body aches but feels better today.  Did not check temp but did feel like he had a fever.  Has a sore throat today, no longer nauseated.  No diarrhea.  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  Current Outpatient Prescriptions on File Prior to Visit  Medication Sig Dispense Refill  . fenofibrate 54 MG tablet Take 1 tablet (54 mg total) by mouth daily. 90 tablet 3  . levothyroxine (LEVOTHROID) 25 MCG tablet Take 1 tablet (25 mcg total) by mouth daily before breakfast. 90 tablet 0   No current facility-administered medications on file prior to visit.    No Known Allergies  Past Medical History  Diagnosis Date  . History of chicken pox   . History of hyperlipidemia   . Thyroid disease     Past Surgical History  Procedure Laterality Date  . Hand surgery  1997  . Appendectomy  2011    Family History  Problem Relation Age of Onset  . Cancer - Lung Paternal Grandfather   . Hyperlipidemia Maternal Grandmother   . Stroke Maternal Grandmother   . Hypertension Maternal Grandmother   . Diabetes Paternal Grandmother     well controlled with good diet    Social History   Social History  . Marital Status: Married    Spouse Name: N/A  . Number of Children: N/A  . Years of Education: N/A   Occupational History  . Not on file.   Social History Main Topics  . Smoking status: Never Smoker   . Smokeless tobacco: Never Used  . Alcohol Use: 0.0 oz/week    0 Standard drinks or equivalent per week     Comment: occ  . Drug Use: No  . Sexual Activity: Not on file   Other Topics Concern  . Not on file   Social History Narrative   The PMH, PSH, Social History,  Family History, Medications, and allergies have been reviewed in [facility], and have been updated if relevant.   Review of Systems  Constitutional: Positive for fever, chills and fatigue. Negative for diaphoresis.  HENT: Positive for sore throat. Negative for tinnitus, trouble swallowing and voice change.   Respiratory: Negative for cough and shortness of breath.   Gastrointestinal: Positive for nausea. Negative for vomiting, diarrhea and rectal pain.  Musculoskeletal: Negative.   Skin: Negative.   Allergic/Immunologic: Negative.   Neurological: Negative.   All other systems reviewed and are negative.      Objective:    BP 120/70 mmHg  Pulse 66  Temp(Src) 98 F (36.7 C) (Oral)  Wt 231 lb (104.781 kg)   Physical Exam  Constitutional: He is oriented to person, place, and time. He appears well-developed and well-nourished. No distress.  HENT:  Head: Normocephalic.  Mouth/Throat: Uvula is midline and mucous membranes are normal. Oropharyngeal exudate and posterior oropharyngeal erythema present. No posterior oropharyngeal edema or tonsillar abscesses.  Eyes: Conjunctivae are normal.  Cardiovascular: Normal rate and regular rhythm.   Pulmonary/Chest: Effort normal and breath sounds normal.  Abdominal: Soft. Bowel sounds are normal. He exhibits no distension and no mass. There is no tenderness. There  is no rebound and no guarding.  Neurological: He is alert and oriented to person, place, and time. No cranial nerve deficit.  Skin: Skin is warm and dry. He is not diaphoretic.  Psychiatric: He has a normal mood and affect. His behavior is normal. Judgment and thought content normal.  Nursing note and vitals reviewed.         Assessment & Plan:   Sore throat - Plan: Rapid Strep A  Acute pharyngitis, unspecified etiology No Follow-up on file.

## 2015-12-25 NOTE — Assessment & Plan Note (Signed)
New- rapid strep positive and exam and symptoms consistent with strep pharyngitis. Treat with PCN 500 mg three times daily x 10 days. Ibuprofen as needed for swelling, fever, pain. Call or return to clinic prn if these symptoms worsen or fail to improve as anticipated. The patient indicates understanding of these issues and agrees with the plan.

## 2016-01-30 ENCOUNTER — Ambulatory Visit: Payer: BC Managed Care – PPO | Admitting: Family Medicine

## 2016-04-12 ENCOUNTER — Other Ambulatory Visit: Payer: Self-pay | Admitting: Family Medicine

## 2016-04-14 NOTE — Telephone Encounter (Signed)
Please schedule his lab appt and refill times one  Thanks

## 2016-04-14 NOTE — Telephone Encounter (Signed)
Last TSH was high and pt cancelled his f/u appt with you to have it rechecked please advise

## 2016-04-14 NOTE — Telephone Encounter (Signed)
appt scheduled for tomorrow. Med not filled yet incase Dr. Milinda Antisower has to change his dose (dosen't want a 3 month supply of wrong dose of med), pt said he has enough med until we get his labs back  I will hold refill request until then

## 2016-04-15 ENCOUNTER — Other Ambulatory Visit (INDEPENDENT_AMBULATORY_CARE_PROVIDER_SITE_OTHER): Payer: BC Managed Care – PPO

## 2016-04-15 DIAGNOSIS — E039 Hypothyroidism, unspecified: Secondary | ICD-10-CM | POA: Diagnosis not present

## 2016-04-15 DIAGNOSIS — E781 Pure hyperglyceridemia: Secondary | ICD-10-CM

## 2016-04-15 LAB — LIPID PANEL
CHOL/HDL RATIO: 6
Cholesterol: 204 mg/dL — ABNORMAL HIGH (ref 0–200)
HDL: 32.3 mg/dL — ABNORMAL LOW (ref >39.00)
NonHDL: 171.42
Triglycerides: 316 mg/dL — ABNORMAL HIGH (ref 0.0–149.0)
VLDL: 63.2 mg/dL — ABNORMAL HIGH (ref 0.0–40.0)

## 2016-04-15 LAB — LDL CHOLESTEROL, DIRECT: LDL DIRECT: 109 mg/dL

## 2016-04-15 LAB — TSH: TSH: 12.74 u[IU]/mL — AB (ref 0.35–4.50)

## 2016-05-06 ENCOUNTER — Encounter: Payer: Self-pay | Admitting: Family Medicine

## 2016-05-06 ENCOUNTER — Ambulatory Visit (INDEPENDENT_AMBULATORY_CARE_PROVIDER_SITE_OTHER): Payer: BC Managed Care – PPO | Admitting: Family Medicine

## 2016-05-06 VITALS — BP 122/78 | HR 58 | Temp 98.2°F | Ht 72.25 in | Wt 227.5 lb

## 2016-05-06 DIAGNOSIS — E039 Hypothyroidism, unspecified: Secondary | ICD-10-CM

## 2016-05-06 DIAGNOSIS — E781 Pure hyperglyceridemia: Secondary | ICD-10-CM

## 2016-05-06 DIAGNOSIS — E669 Obesity, unspecified: Secondary | ICD-10-CM

## 2016-05-06 MED ORDER — LEVOTHYROXINE SODIUM 50 MCG PO TABS: 50.0000 ug | ORAL_TABLET | Freq: Every day | ORAL | Status: DC

## 2016-05-06 NOTE — Progress Notes (Signed)
Subjective:    Patient ID: Adam Weber, male    DOB: 04/26/1976, 40 y.o.   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    Hypothyroidism  Pt has no clinical changes No change in energy level/ hair or skin/ edema and no tremor Lab Results  Component Value Date   TSH 12.74* 04/15/2016    This is  Actually up from 7.2  on levothyroxine 25 mcg - had gone up to 50 and then back down  (takes fenofibrate with it- 30-45 minutes before breakfast)  Sometimes he gets tired - could be from schedule  No skin or hair changes    Cholesterol Hx of high triglycerides-on fenofibrate Lab Results  Component Value Date   CHOL 204* 04/15/2016   CHOL 210* 11/23/2015   CHOL 190 07/17/2014   Lab Results  Component Value Date   HDL 32.30* 04/15/2016   HDL 32* 11/23/2015   HDL 31.60* 07/17/2014   Lab Results  Component Value Date   LDLCALC 139* 11/23/2015   LDLCALC 122* 07/17/2014   Lab Results  Component Value Date   TRIG 316.0* 04/15/2016   TRIG 193* 11/23/2015   TRIG 183.0* 07/17/2014   Lab Results  Component Value Date   CHOLHDL 6 04/15/2016   CHOLHDL 6.6* 11/23/2015   CHOLHDL 6 07/17/2014   Lab Results  Component Value Date   LDLDIRECT 109.0 04/15/2016   Trig are up (not fasting for labs) - has not missed doses of fenofibrate  LDL is down from 139 to 109 -eating more salads and low fat foods  Tries to avoid fast food when he can - chooses grilled chicken   Patient Active Problem List   Diagnosis Date Noted  . Acute pharyngitis 12/25/2015  . Obesity 11/25/2015  . Elevated BP 12/27/2014  . IBS (irritable bowel syndrome) 12/27/2014  . Seasonal allergies 06/01/2013  . Hypertriglyceridemia 05/17/2013   . Hypothyroidism 05/17/2013   Past Medical History  Diagnosis Date  . History of chicken pox   . History of hyperlipidemia   . Thyroid disease    Past Surgical History  Procedure Laterality Date  . Hand surgery  1997  . Appendectomy  2011   Social History  Substance Use Topics  . Smoking status: Never Smoker   . Smokeless tobacco: Never Used  . Alcohol Use: 0.0 oz/week    0 Standard drinks or equivalent per week     Comment: occ   Family History  Problem Relation Age of Onset  . Cancer - Lung Paternal Grandfather   . Hyperlipidemia Maternal Grandmother   . Stroke Maternal Grandmother   . Hypertension Maternal Grandmother   . Diabetes Paternal Grandmother     well controlled with good diet   No Known Allergies Current Outpatient Prescriptions on File Prior to Visit  Medication Sig Dispense Refill  . fenofibrate 54 MG tablet Take 1 tablet (54 mg total) by mouth daily. 90 tablet 3   No current facility-administered medications on file prior to visit.    Review of Systems Review of Systems  Constitutional: Negative for fever, appetite change, fatigue and unexpected weight change.  Eyes: Negative for pain and visual disturbance.  Respiratory: Negative for cough and shortness of breath.   Cardiovascular: Negative for cp or palpitations    Gastrointestinal: Negative for nausea, diarrhea and constipation.  Genitourinary: Negative for urgency and frequency.  Skin: Negative for pallor or rash   Neurological: Negative for weakness, light-headedness, numbness and headaches. neg for tremor  Hematological: Negative for adenopathy. Does not bruise/bleed easily.  Psychiatric/Behavioral: Negative for dysphoric mood. The patient is not nervous/anxious.         Objective:   Physical Exam  Constitutional: He appears well-developed and well-nourished. No distress.  overwt and well app  HENT:  Head: Normocephalic and atraumatic.  Mouth/Throat: Oropharynx is clear and moist.    Eyes: Conjunctivae and EOM are normal. Pupils are equal, round, and reactive to light.  Neck: Normal range of motion. Neck supple. No JVD present. Carotid bruit is not present. No thyromegaly present.  Cardiovascular: Normal rate, regular rhythm, normal heart sounds and intact distal pulses.  Exam reveals no gallop.   Pulmonary/Chest: Effort normal and breath sounds normal. No respiratory distress. He has no wheezes. He has no rales.  No crackles  Musculoskeletal: He exhibits no edema.  Lymphadenopathy:    He has no cervical adenopathy.  Neurological: He is alert. He has normal reflexes.  Skin: Skin is warm and dry. No rash noted.  Psychiatric: He has a normal mood and affect.          Assessment & Plan:   Problem List Items Addressed This Visit      Endocrine   Hypothyroidism - Primary    Lab Results  Component Value Date   TSH 12.74* 04/15/2016   Will inc levothyroxine to 50 mcg daily (am before bkfast or other meds)  Update if side eff No change in exam  Lab 6 wk      Relevant Medications   levothyroxine (SYNTHROID, LEVOTHROID) 50 MCG tablet     Other   Obesity    Wt down 4-5 lb with decrease in calories and fat (enc to keep dec sweet tea) Very active lifestyle  Discussed how this problem influences overall health and the risks it imposes  Reviewed plan for weight loss with lower calorie diet (via better food choices and also portion control or program like weight watchers) and exercise building up to or more than 30 minutes 5 days per week including some aerobic activity         Hypertriglyceridemia    Trig are up (non fasting) -but significant dec in LDL (reassuring) Better diet Disc goals for lipids and reasons to control them Rev labs with pt Rev low sat fat diet in detail Enc low fat diet Enc exercise and fish consumption to help raise HDL

## 2016-05-06 NOTE — Assessment & Plan Note (Signed)
Wt down 4-5 lb with decrease in calories and fat (enc to keep dec sweet tea) Very active lifestyle  Discussed how this problem influences overall health and the risks it imposes  Reviewed plan for weight loss with lower calorie diet (via better food choices and also portion control or program like weight watchers) and exercise building up to or more than 30 minutes 5 days per week including some aerobic activity

## 2016-05-06 NOTE — Assessment & Plan Note (Signed)
Lab Results  Component Value Date   TSH 12.74* 04/15/2016   Will inc levothyroxine to 50 mcg daily (am before bkfast or other meds)  Update if side eff No change in exam  Lab 6 wk

## 2016-05-06 NOTE — Assessment & Plan Note (Signed)
Trig are up (non fasting) -but significant dec in LDL (reassuring) Better diet Disc goals for lipids and reasons to control them Rev labs with pt Rev low sat fat diet in detail Enc low fat diet Enc exercise and fish consumption to help raise HDL

## 2016-05-06 NOTE — Patient Instructions (Signed)
Schedule non fasting lab for 6 weeks for thyroid  Increase levothyroxine to 50 mcg once daily in am 30 min before breakfast Take your fenofibrate in the evening  Eat a low fat diet   (Avoid red meat/ fried foods/ egg yolks/ fatty breakfast meats/ butter, cheese and high fat dairy/ and shellfish) Keep working on reducing sweet tea- it has helped weight loss   Fit in exercise when you can

## 2016-05-06 NOTE — Progress Notes (Signed)
Pre visit review using our clinic review tool, if applicable. No additional management support is needed unless otherwise documented below in the visit note. 

## 2016-06-22 ENCOUNTER — Telehealth: Payer: Self-pay | Admitting: Family Medicine

## 2016-06-22 DIAGNOSIS — E039 Hypothyroidism, unspecified: Secondary | ICD-10-CM

## 2016-06-22 DIAGNOSIS — E781 Pure hyperglyceridemia: Secondary | ICD-10-CM

## 2016-06-22 NOTE — Telephone Encounter (Signed)
-----   Message from Alvina Chouerri J Walsh sent at 06/18/2016 11:12 AM EDT ----- Regarding: Lab orders for Tuesday, 7.11.17 Lab orders for a 6 month follow up appt

## 2016-06-24 ENCOUNTER — Other Ambulatory Visit (INDEPENDENT_AMBULATORY_CARE_PROVIDER_SITE_OTHER): Payer: BC Managed Care – PPO

## 2016-06-24 DIAGNOSIS — E039 Hypothyroidism, unspecified: Secondary | ICD-10-CM

## 2016-06-24 LAB — TSH: TSH: 12.13 u[IU]/mL — AB (ref 0.35–4.50)

## 2016-06-26 ENCOUNTER — Telehealth: Payer: Self-pay | Admitting: *Deleted

## 2016-06-26 MED ORDER — LEVOTHYROXINE SODIUM 100 MCG PO TABS: 100.0000 ug | ORAL_TABLET | Freq: Every day | ORAL | Status: DC

## 2016-06-26 NOTE — Telephone Encounter (Signed)
Pt notified of lab results and Dr. Royden Purlower's comments. Pt hasn't missed any doses of med so new Rx sent to pharmacy and lab appt scheduled

## 2016-06-26 NOTE — Telephone Encounter (Signed)
-----   Message from Judy PimpleMarne A Tower, MD sent at 06/24/2016  9:20 PM EDT ----- Released on mychart-need to raise dose again  He is on 50 mcg- I want to inc to 100 unless he has missed doses  Please call in levothyroxine to 100 mcg 1 po qd #30 3 refills  Re check tsh 4-6 wk please

## 2016-08-04 ENCOUNTER — Other Ambulatory Visit: Payer: BC Managed Care – PPO

## 2016-08-06 ENCOUNTER — Other Ambulatory Visit: Payer: BC Managed Care – PPO

## 2016-09-15 ENCOUNTER — Other Ambulatory Visit (INDEPENDENT_AMBULATORY_CARE_PROVIDER_SITE_OTHER): Payer: BC Managed Care – PPO

## 2016-09-15 DIAGNOSIS — E039 Hypothyroidism, unspecified: Secondary | ICD-10-CM

## 2016-09-15 DIAGNOSIS — E781 Pure hyperglyceridemia: Secondary | ICD-10-CM | POA: Diagnosis not present

## 2016-09-15 LAB — LIPID PANEL
CHOLESTEROL: 151 mg/dL (ref 0–200)
HDL: 31.8 mg/dL — ABNORMAL LOW (ref >39.00)
NonHDL: 119.3
TRIGLYCERIDES: 250 mg/dL — AB (ref 0.0–149.0)
Total CHOL/HDL Ratio: 5
VLDL: 50 mg/dL — ABNORMAL HIGH (ref 0.0–40.0)

## 2016-09-15 LAB — AST: AST: 18 U/L (ref 0–37)

## 2016-09-15 LAB — ALT: ALT: 17 U/L (ref 0–53)

## 2016-09-15 LAB — LDL CHOLESTEROL, DIRECT: Direct LDL: 86 mg/dL

## 2016-09-15 LAB — TSH: TSH: 2.4 u[IU]/mL (ref 0.35–4.50)

## 2016-09-15 NOTE — Addendum Note (Signed)
Addended by: Baldomero LamyHAVERS, NATASHA C on: 09/15/2016 03:38 PM   Modules accepted: Orders

## 2016-09-16 ENCOUNTER — Telehealth: Payer: Self-pay | Admitting: *Deleted

## 2016-09-16 MED ORDER — LEVOTHYROXINE SODIUM 100 MCG PO TABS: 100.0000 ug | ORAL_TABLET | Freq: Every day | ORAL | 5 refills | Status: DC

## 2016-09-16 NOTE — Telephone Encounter (Signed)
TSH was in normal range. Rx filled

## 2016-09-16 NOTE — Telephone Encounter (Signed)
-----   Message from Baldomero LamyNatasha C Chavers sent at 09/15/2016  4:34 PM EDT ----- Regarding: pt needs synthroid rx sent in, when labs are resulted. Pt is out of his synthroid, needs rx called in as soon as tsh is resulted.  Thanks Rodney Boozeasha

## 2016-12-19 ENCOUNTER — Other Ambulatory Visit: Payer: Self-pay | Admitting: Family Medicine

## 2017-01-12 ENCOUNTER — Other Ambulatory Visit: Payer: Self-pay | Admitting: Family Medicine

## 2017-01-29 ENCOUNTER — Ambulatory Visit (INDEPENDENT_AMBULATORY_CARE_PROVIDER_SITE_OTHER): Payer: BC Managed Care – PPO | Admitting: Medical

## 2017-01-29 ENCOUNTER — Encounter: Payer: Self-pay | Admitting: Medical

## 2017-01-29 VITALS — BP 116/75 | HR 87 | Temp 102.1°F | Ht 72.25 in | Wt 234.4 lb

## 2017-01-29 DIAGNOSIS — J029 Acute pharyngitis, unspecified: Secondary | ICD-10-CM | POA: Diagnosis not present

## 2017-01-29 DIAGNOSIS — J020 Streptococcal pharyngitis: Secondary | ICD-10-CM

## 2017-01-29 DIAGNOSIS — R52 Pain, unspecified: Secondary | ICD-10-CM

## 2017-01-29 LAB — POCT RAPID STREP A (OFFICE): Rapid Strep A Screen: POSITIVE — AB

## 2017-01-29 LAB — POCT INFLUENZA A/B
INFLUENZA A, POC: NEGATIVE
Influenza B, POC: NEGATIVE

## 2017-01-29 MED ORDER — AMOXICILLIN 875 MG PO TABS: 875.0000 mg | ORAL_TABLET | Freq: Two times a day (BID) | ORAL | 0 refills | Status: DC

## 2017-01-29 MED ORDER — PENICILLIN G BENZATHINE 1200000 UNIT/2ML IM SUSP
1.2000 10*6.[IU] | Freq: Once | INTRAMUSCULAR | Status: AC
  Administered 2017-01-29: 1.2 10*6.[IU] via INTRAMUSCULAR

## 2017-01-29 NOTE — Patient Instructions (Addendum)
Today we gave bicillin  IM antibiotic. Then tomorrow start amoxicillin for next 10 days.  The throat should improve pretty quickly. If tonsils swell more than baseline current large size then ED evaluation.   For body aches and fever can alternate tyelenol and ibuprofen as directed.  Follow up in 7 days or as needed

## 2017-01-29 NOTE — Progress Notes (Signed)
Pre visit review using our clinic review tool, if applicable. No additional management support is needed unless otherwise documented below in the visit note. 

## 2017-01-29 NOTE — Progress Notes (Signed)
Subjective:    Patient ID: Adam Weber, male    DOB: 09-Dec-1976, 41 y.o.   MRN: [id number]  HPI  Pt in with one day of moderate sore throat to sever sore throat with pain swallowing and body aches. No ha presently. No neck stiffness. No vomiting. Mild nauseau.  Pt strep test +. Flu test -.  Pt is a Runner, broadcasting/film/video.  Pt has baseline large tonsils.   Review of Systems  Constitutional: Positive for chills, fatigue and fever.  HENT: Positive for sore throat. Negative for congestion, mouth sores, postnasal drip, rhinorrhea, sinus pain, sinus pressure, trouble swallowing and voice change.   Respiratory: Positive for cough. Negative for chest tightness, shortness of breath and wheezing.   Cardiovascular: Negative for chest pain and palpitations.  Gastrointestinal: Negative for abdominal pain.  Psychiatric/Behavioral: Negative for behavioral problems and confusion.    Past Medical History:  Diagnosis Date  . History of chicken pox   . History of hyperlipidemia   . Thyroid disease      Social History   Social History  . Marital status: Married    Spouse name: N/A  . Number of children: N/A  . Years of education: N/A   Occupational History  . Not on file.   Social History Main Topics  . Smoking status: Never Smoker  . Smokeless tobacco: Never Used  . Alcohol use 0.0 oz/week     Comment: occ  . Drug use: No  . Sexual activity: Not on file   Other Topics Concern  . Not on file   Social History Narrative  . No narrative on file    Past Surgical History:  Procedure Laterality Date  . APPENDECTOMY  2011  . HAND SURGERY  1997    Family History  Problem Relation Age of Onset  . Cancer - Lung Paternal Grandfather   . Hyperlipidemia Maternal Grandmother   . Stroke Maternal Grandmother   . Hypertension Maternal Grandmother   . Diabetes Paternal Grandmother     well controlled with good diet    No Known Allergies  Current Outpatient Prescriptions on File Prior to  Visit  Medication Sig Dispense Refill  . fenofibrate 54 MG tablet TAKE 1 TABLET (54 MG TOTAL) BY MOUTH DAILY. 90 tablet 1  . levothyroxine (SYNTHROID, LEVOTHROID) 100 MCG tablet TAKE 1 TABLET (100 MCG TOTAL) BY MOUTH DAILY. 30 tablet 2   No current facility-administered medications on file prior to visit.     BP 116/75   Pulse 87   Temp (!) 102.1 F (38.9 C) (Oral)   Ht 6' 0.25" (1.835 m)   Wt 234 lb 6.4 oz (106.3 kg)   SpO2 98%   BMI 31.57 kg/m       Objective:   Physical Exam  General  Mental Status - Alert. General Appearance - Well groomed. Not in acute distress.  Skin Rashes- No Rashes.  HEENT Head- Normal. Ear Auditory Canal - Left- Normal. Right - Normal.Tympanic Membrane- Left- Normal. Right- Normal. Eye Sclera/Conjunctiva- Left- Normal. Right- Normal. Nose & Sinuses Nasal Mucosa- Left-  Boggy and Congested. Right-  Boggy and  Congested.Bilateral no maxillary and  No frontal sinus pressure. Mouth & Throat Lips: Upper Lip- Normal: no dryness, cracking, pallor, cyanosis, or vesicular eruption. Lower Lip-Normal: no dryness, cracking, pallor, cyanosis or vesicular eruption. Buccal Mucosa- Bilateral- No Aphthous ulcers. Oropharynx- No Discharge or Erythema. Tonsils: Characteristics- Bilateral- Erythema and Congestion. Size/Enlargement- Bilateral- 2+ Enlargement(baseline large per pt). Discharge- bilateral-None.  Neck Neck- Supple.  No Masses.   Chest and Lung Exam Auscultation: Breath Sounds:-Clear even and unlabored.  Cardiovascular Auscultation:Rythm- Regular, rate and rhythm. Murmurs & Other Heart Sounds:Ausculatation of the heart reveal- No Murmurs.  Lymphatic Head & Neck General Head & Neck Lymphatics: Bilateral: Description- moderate large tonsils.       Assessment & Plan:  Today we gave bicillin  IM antibiotic. Then tomorrow start amoxicillin for next 10 days.  The throat should improve pretty quickly. If tonsils swell more than baseline  current large size then ED evaluation.   For body aches and fever can alternate tyelenol and ibuprofen as directed.  Follow up in 7 days or as needed  Note does not appear to be peritonsillar abscess or any airway compromise.

## 2017-01-30 ENCOUNTER — Ambulatory Visit: Payer: BC Managed Care – PPO | Admitting: Primary Care

## 2017-04-12 ENCOUNTER — Other Ambulatory Visit: Payer: Self-pay | Admitting: Family Medicine

## 2017-04-17 ENCOUNTER — Ambulatory Visit (INDEPENDENT_AMBULATORY_CARE_PROVIDER_SITE_OTHER): Payer: BC Managed Care – PPO | Admitting: Primary Care

## 2017-04-17 ENCOUNTER — Encounter: Payer: Self-pay | Admitting: Primary Care

## 2017-04-17 VITALS — BP 144/94 | HR 64 | Temp 98.2°F | Ht 72.25 in | Wt 232.0 lb

## 2017-04-17 DIAGNOSIS — J302 Other seasonal allergic rhinitis: Secondary | ICD-10-CM

## 2017-04-17 MED ORDER — FLUTICASONE PROPIONATE 50 MCG/ACT NA SUSP: 1.0000 | Freq: Two times a day (BID) | NASAL | 0 refills | Status: DC

## 2017-04-17 MED ORDER — LEVOCETIRIZINE DIHYDROCHLORIDE 5 MG PO TABS: 5.0000 mg | ORAL_TABLET | Freq: Every evening | ORAL | 0 refills | Status: DC

## 2017-04-17 NOTE — Patient Instructions (Signed)
Nasal Congestion/Ear Pressure: Try using Flonase (fluticasone) nasal spray. Instill 1 spray in each nostril twice daily.   Start levocetirizine (Xyzal) 5 mg tablets for allergies. Take 1 tablet by mouth every night at bedtime.  Ensure you are staying hydrated with water and rest.  Please call me Monday next week if no improvement or if your symptoms progress.   It was a pleasure meeting you!

## 2017-04-17 NOTE — Progress Notes (Signed)
   Subjective:    Patient ID: Adam Weber, male    DOB: 09/12/1976, 41 y.o.   MRN: [id number]  HPI  [name] is a 41 year old male with a history of seasonal allergies who presents today with a chief complaint of nasal congestion. He also reports sinus pressure, headache, cough. His symptoms began 5 days ago. He's been taking Sudafed, Claritin, Sinus and Allergy, Nyquil without much improvement. His cough is productive with clear sputum.   Review of Systems  Constitutional: Negative for chills, fatigue and fever.  HENT: Positive for congestion, postnasal drip and sinus pressure. Negative for sore throat.   Respiratory: Positive for cough. Negative for shortness of breath and wheezing.         Past Medical History:  Diagnosis Date  . History of chicken pox   . History of hyperlipidemia   . Thyroid disease      Social History   Social History  . Marital status: Married    Spouse name: N/A  . Number of children: N/A  . Years of education: N/A   Occupational History  . Not on file.   Social History Main Topics  . Smoking status: Never Smoker  . Smokeless tobacco: Never Used  . Alcohol use 0.0 oz/week     Comment: occ  . Drug use: No  . Sexual activity: Not on file   Other Topics Concern  . Not on file   Social History Narrative  . No narrative on file    Past Surgical History:  Procedure Laterality Date  . APPENDECTOMY  2011  . HAND SURGERY  1997    Family History  Problem Relation Age of Onset  . Cancer - Lung Paternal Grandfather   . Hyperlipidemia Maternal Grandmother   . Stroke Maternal Grandmother   . Hypertension Maternal Grandmother   . Diabetes Paternal Grandmother     well controlled with good diet    No Known Allergies  Current Outpatient Prescriptions on File Prior to Visit  Medication Sig Dispense Refill  . fenofibrate 54 MG tablet TAKE 1 TABLET (54 MG TOTAL) BY MOUTH DAILY. 90 tablet 1  . levothyroxine (SYNTHROID, LEVOTHROID) 100  MCG tablet TAKE 1 TABLET (100 MCG TOTAL) BY MOUTH DAILY. 30 tablet 1   No current facility-administered medications on file prior to visit.     BP (!) 144/94   Pulse 64   Temp 98.2 F (36.8 C) (Oral)   Ht 6' 0.25" (1.835 m)   Wt 232 lb (105.2 kg)   SpO2 97%   BMI 31.25 kg/m    Objective:   Physical Exam  Constitutional: He appears well-nourished. He does not appear ill.  HENT:  Right Ear: Tympanic membrane and ear canal normal.  Left Ear: Tympanic membrane and ear canal normal.  Nose: Mucosal edema present. Right sinus exhibits maxillary sinus tenderness. Right sinus exhibits no frontal sinus tenderness. Left sinus exhibits maxillary sinus tenderness. Left sinus exhibits no frontal sinus tenderness.  Mouth/Throat: Oropharynx is clear and moist.  Eyes: Conjunctivae are normal.  Neck: Neck supple.  Cardiovascular: Normal rate and regular rhythm.   Pulmonary/Chest: Effort normal and breath sounds normal. He has no wheezes. He has no rales.  Skin: Skin is warm and dry.          Assessment & Plan:

## 2017-04-17 NOTE — Assessment & Plan Note (Signed)
Symptoms today representative of allergy involvement. He does not appear ill, no suspicion for bacterial involvement at this point. We'll treat with Flonase and Xyzal, prescription for both at the pharmacy. He will update if symptoms progress, no improvement in 3-4 days.

## 2017-04-17 NOTE — Progress Notes (Signed)
Pre visit review using our clinic review tool, if applicable. No additional management support is needed unless otherwise documented below in the visit note. 

## 2017-04-20 ENCOUNTER — Telehealth: Payer: Self-pay | Admitting: *Deleted

## 2017-04-20 MED ORDER — AMOXICILLIN-POT CLAVULANATE 875-125 MG PO TABS: 1.0000 | ORAL_TABLET | Freq: Two times a day (BID) | ORAL | 0 refills | Status: DC

## 2017-04-20 NOTE — Telephone Encounter (Signed)
Patient returned Shapale's call.  I let patient know rx was sent to pharmacy and Dr.Tower's comments.

## 2017-04-20 NOTE — Telephone Encounter (Signed)
I will cover him with augmentin for sinusitis f/u if no imp in the next 7-10 days If new symptoms please let me know

## 2017-04-20 NOTE — Telephone Encounter (Signed)
Left voicemail requesting pt to call the office back 

## 2017-04-20 NOTE — Telephone Encounter (Signed)
Pt returned call

## 2017-04-20 NOTE — Telephone Encounter (Signed)
Spoke to pt who states he was recently seen and given an Rx for flonase and xyzal. He was advised if there was no improvement over the weekend to contact office back. He is wanting to know if he can have an abx sent to pharmacy on file. Pls advise

## 2017-05-14 ENCOUNTER — Other Ambulatory Visit: Payer: Self-pay | Admitting: Primary Care

## 2017-05-14 DIAGNOSIS — J302 Other seasonal allergic rhinitis: Secondary | ICD-10-CM

## 2017-06-12 ENCOUNTER — Other Ambulatory Visit: Payer: Self-pay | Admitting: Family Medicine

## 2017-06-12 NOTE — Telephone Encounter (Signed)
Please schedule f/u and refill until then  

## 2017-06-12 NOTE — Telephone Encounter (Signed)
You haven't seen pt in over a year and no recent TSH labs either, also no future appts., please advise

## 2017-06-12 NOTE — Telephone Encounter (Signed)
Left voicemail requesting pt to call the office back 

## 2017-06-15 ENCOUNTER — Telehealth: Payer: Self-pay | Admitting: Family Medicine

## 2017-06-15 DIAGNOSIS — E039 Hypothyroidism, unspecified: Secondary | ICD-10-CM

## 2017-06-15 NOTE — Telephone Encounter (Signed)
Pt schedule appt with Larita FifeLynn, Rx refilled

## 2017-06-15 NOTE — Telephone Encounter (Signed)
-----   Message from Alvina Chouerri J Walsh sent at 06/15/2017  3:05 PM EDT ----- Regarding: Lab orders for Tuesday, 7.3.18 Lab orders for a f/u appt

## 2017-06-16 ENCOUNTER — Other Ambulatory Visit (INDEPENDENT_AMBULATORY_CARE_PROVIDER_SITE_OTHER): Payer: BC Managed Care – PPO

## 2017-06-16 ENCOUNTER — Other Ambulatory Visit: Payer: BC Managed Care – PPO

## 2017-06-16 DIAGNOSIS — E039 Hypothyroidism, unspecified: Secondary | ICD-10-CM

## 2017-06-16 LAB — TSH: TSH: 6.7 u[IU]/mL — ABNORMAL HIGH (ref 0.35–4.50)

## 2017-06-24 ENCOUNTER — Ambulatory Visit (INDEPENDENT_AMBULATORY_CARE_PROVIDER_SITE_OTHER): Payer: BC Managed Care – PPO | Admitting: Family Medicine

## 2017-06-24 ENCOUNTER — Encounter: Payer: Self-pay | Admitting: Family Medicine

## 2017-06-24 VITALS — BP 132/86 | HR 52 | Temp 98.0°F | Ht 72.25 in | Wt 234.8 lb

## 2017-06-24 DIAGNOSIS — Z23 Encounter for immunization: Secondary | ICD-10-CM | POA: Diagnosis not present

## 2017-06-24 DIAGNOSIS — Z6831 Body mass index (BMI) 31.0-31.9, adult: Secondary | ICD-10-CM | POA: Diagnosis not present

## 2017-06-24 DIAGNOSIS — E039 Hypothyroidism, unspecified: Secondary | ICD-10-CM

## 2017-06-24 DIAGNOSIS — E6609 Other obesity due to excess calories: Secondary | ICD-10-CM | POA: Diagnosis not present

## 2017-06-24 DIAGNOSIS — E781 Pure hyperglyceridemia: Secondary | ICD-10-CM | POA: Diagnosis not present

## 2017-06-24 NOTE — Assessment & Plan Note (Signed)
Lab Results  Component Value Date   TSH 6.70 (H) 06/16/2017   Will inc levothyroxine to 112 mcg daily  Lab 6 wk  A little sluggish Taking correctly No goiter

## 2017-06-24 NOTE — Progress Notes (Signed)
Subjective:    Patient ID: Adam Weber, male    DOB: 05-08-1976, 41 y.o.   MRN: [id number]  HPI   Subjective:    Patient ID: Adam Weber, male    DOB: March 22, 1976, 41 y.o.   MRN: [id number]  HPI Here for f/u of chronic health problems   Feeling good in general  occ his R hip bothers him / has back pain anyway -thinks it is related  occ goes into his groin  Has not noticed a bulge   Does not alter his activities  Does not do specific stretching   Wt Readings from Last 3 Encounters:  06/24/17 234 lb 12 oz (106.5 kg)  04/17/17 232 lb (105.2 kg)  01/29/17 234 lb 6.4 oz (106.3 kg)  wt is stable  Diet is fair / tries to avoid a lot of junk food  Sweet tea is his biggest weakness- tries to dillute it / also gatorade   (no soft drinks) Active job [employer]) and active with kids -would like to walk more  bmi is 31.6  Last tetanus shot - ? / will get today   Hypothyroidism (takes levothyroxine in the am before bkfaast and not with other medicines) No missed doses  Pt has no clinical changes No goiter or neck change  No hair or skin/ edema and no tremor   More sluggish / also some leg cramps  Lab Results  Component Value Date   TSH 6.70 (H) 06/16/2017    We will inc dose      Hx of elevated cholesterol /trig Lab Results  Component Value Date   CHOL 151 09/15/2016   HDL 31.80 (L) 09/15/2016   LDLCALC 139 (H) 11/23/2015   LDLDIRECT 86.0 09/15/2016   TRIG 250.0 (H) 09/15/2016   CHOLHDL 5 09/15/2016   On fenofibrate Does not watch fat in diet     BP Readings from Last 3 Encounters:  06/24/17 132/86  04/17/17 (!) 144/94  01/29/17 116/75     Chemistry      Component Value Date/Time   NA 142 11/23/2015 1602   K 4.7 11/23/2015 1602   CL 105 11/23/2015 1602   CO2 28 11/23/2015 1602   BUN 15 11/23/2015 1602   CREATININE 1.20 11/23/2015 1602      Component Value Date/Time   CALCIUM 9.3 11/23/2015 1602   ALKPHOS 58 11/23/2015 1602   AST 18  09/15/2016 1538   ALT 17 09/15/2016 1538   BILITOT 0.5 11/23/2015 1602      Patient Active Problem List   Diagnosis Date Noted  . Obesity 11/25/2015  . Elevated BP 12/27/2014  . IBS (irritable bowel syndrome) 12/27/2014  . Seasonal allergies 06/01/2013  . 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 Hypothyroidism 05/17/2013   Past Medical History:  Diagnosis Date  . History of chicken pox   . History of hyperlipidemia   . Thyroid disease    Past Surgical History:  Procedure Laterality Date  . APPENDECTOMY  2011  . HAND SURGERY  1997   Social History  Substance Use Topics  . Smoking status: Never Smoker  . Smokeless tobacco: Never Used  . Alcohol use 0.0 oz/week     Comment: occ   Family History  Problem Relation Age of Onset  . Cancer - Lung Paternal Grandfather   . Hyperlipidemia Maternal Grandmother   . Stroke Maternal Grandmother   . Hypertension Maternal Grandmother   . Diabetes Paternal Grandmother        well  controlled with good diet   No Known Allergies Current Outpatient Prescriptions on File Prior to Visit  Medication Sig Dispense Refill  . fenofibrate 54 MG tablet TAKE 1 TABLET (54 MG TOTAL) BY MOUTH DAILY. 90 tablet 1   No current facility-administered medications on file prior to visit.       Review of Systems Review of Systems  Constitutional: Negative for fever, appetite change, fatigue and unexpected weight change.  Eyes: Negative for pain and visual disturbance.  Respiratory: Negative for cough and shortness of breath.   Cardiovascular: Negative for cp or palpitations    Gastrointestinal: Negative for nausea, diarrhea and constipation.  Genitourinary: Negative for urgency and frequency.  Skin: Negative for pallor or rash   MSK pos for R elbow pain from old injury (seeing ortho) , also occ back and R hip pain Neurological: Negative for weakness, light-headedness, numbness and headaches.  Hematological: Negative for adenopathy. Does not bruise/bleed  easily.  Psychiatric/Behavioral: Negative for dysphoric mood. The patient is not nervous/anxious.         Objective:   Physical Exam          Review of Systems     Objective:   Physical Exam  Constitutional: He appears well-developed and well-nourished. No distress.  overwt and well app  HENT:  Head: Normocephalic and atraumatic.  Mouth/Throat: Oropharynx is clear and moist.  Eyes: Conjunctivae and EOM are normal. Pupils are equal, round, and reactive to light.  Neck: Normal range of motion. Neck supple. No JVD present. Carotid bruit is not present. No thyromegaly present.  Cardiovascular: Normal rate, regular rhythm, normal heart sounds and intact distal pulses.  Exam reveals no gallop.   Pulmonary/Chest: Effort normal and breath sounds normal. No respiratory distress. He has no wheezes. He has no rales.  No crackles  Abdominal: Soft. Bowel sounds are normal. He exhibits no distension, no abdominal bruit and no mass. There is no tenderness.  Musculoskeletal: He exhibits no edema.  Lymphadenopathy:    He has no cervical adenopathy.  Neurological: He is alert. He has normal reflexes. He displays no tremor. No cranial nerve deficit. He exhibits normal muscle tone. Coordination normal.  Skin: Skin is warm and dry. No rash noted.  tanned  Psychiatric: He has a normal mood and affect.          Assessment & Plan:   Problem List Items Addressed This Visit      Endocrine   Hypothyroidism    Lab Results  Component Value Date   TSH 6.70 (H) 06/16/2017   Will inc levothyroxine to 112 mcg daily  Lab 6 wk  A little sluggish Taking correctly No goiter        Other   Hypertriglyceridemia    Trig last 250 on fenofibrate Disc goals for lipids and reasons to control them Rev labs with pt Rev low sat fat diet in detail LDL was up  Disc diet to help dec that  Lab planned 6 wk      Obesity - Primary    Discussed how this problem influences overall health and the  risks it imposes  Reviewed plan for weight loss with lower calorie diet (via better food choices and also portion control or program like weight watchers) and exercise building up to or more than 30 minutes 5 days per week including some aerobic activity   Disc imp of change to non sweetened drinks-he will work on that        Other Visit  Diagnoses    Need for Tdap vaccination       Relevant Orders   Tdap vaccine greater than or equal to 7yo IM (Completed)

## 2017-06-24 NOTE — Assessment & Plan Note (Signed)
Discussed how this problem influences overall health and the risks it imposes  Reviewed plan for weight loss with lower calorie diet (via better food choices and also portion control or program like weight watchers) and exercise building up to or more than 30 minutes 5 days per week including some aerobic activity   Disc imp of change to non sweetened drinks-he will work on that

## 2017-06-24 NOTE — Assessment & Plan Note (Signed)
Trig last 250 on fenofibrate Disc goals for lipids and reasons to control them Rev labs with pt Rev low sat fat diet in detail LDL was up  Disc diet to help dec that  Lab planned 6 wk

## 2017-06-24 NOTE — Patient Instructions (Addendum)
Try to drink water and unsweetened beverages  This would help weight loss    Walk more if you can   Increase levothyroxine to 112 mcg daily  Plan lab in 6 weeks for thyroid and cholesterol   Avoid red meat/ fried foods/ egg yolks/ fatty breakfast meats/ butter, cheese and high fat dairy/ and shellfish

## 2017-06-29 ENCOUNTER — Other Ambulatory Visit: Payer: Self-pay | Admitting: Family Medicine

## 2017-07-01 ENCOUNTER — Telehealth: Payer: Self-pay

## 2017-07-01 MED ORDER — LEVOTHYROXINE SODIUM 112 MCG PO TABS: 112.0000 ug | ORAL_TABLET | Freq: Every day | ORAL | 1 refills | Status: DC

## 2017-07-01 NOTE — Telephone Encounter (Signed)
Pt called back and wants levothyroxine [facility] mcg to [facility]. Pt will have labs cked in 6 wks. [facility] and any refills remaining on 100 mcg to be d/c.

## 2017-07-01 NOTE — Telephone Encounter (Signed)
Pt left v/m requesting cb. Left v/m requesting pt to cb. 

## 2017-07-01 NOTE — Telephone Encounter (Addendum)
Pt left v/m; pt thought levothyroxine was to be increased to 112 mcg but med is not at pharmacy. Per 06/24/17 office note levothyroxine was to be inc to 112 mcg. Left pt v/m to cb need to know pharmacy also.

## 2017-08-02 ENCOUNTER — Other Ambulatory Visit: Payer: Self-pay | Admitting: Family Medicine

## 2017-08-25 ENCOUNTER — Telehealth: Payer: Self-pay

## 2017-08-25 NOTE — Telephone Encounter (Signed)
Please refill anything he needs to get by until labs are done (thanks)

## 2017-08-25 NOTE — Telephone Encounter (Signed)
Pt left v/m; pt last seen 06/24/17 and pt knows he is past due to have his labs and pt cannot get labs until mid next week due to work and pt wants to know if OK with Dr Milinda Antisower if he gets his available refills before has lab testing. Pt request cb.

## 2017-08-26 ENCOUNTER — Telehealth: Payer: Self-pay

## 2017-08-26 MED ORDER — LEVOTHYROXINE SODIUM 112 MCG PO TABS: 112.0000 ug | ORAL_TABLET | Freq: Every day | ORAL | 0 refills | Status: DC

## 2017-08-26 NOTE — Telephone Encounter (Signed)
Sounds like they do not have an option but to change it   (please let pt know) If he wants to try a different pharmacy he can  We will want to check tsh about a month after if he changes brand

## 2017-08-26 NOTE — Telephone Encounter (Signed)
Called pt and he advise he just needs his synthroid refilled, done an pt aware

## 2017-08-26 NOTE — Telephone Encounter (Signed)
Adam Weber with [facility] left v/m; [facility] [place] [facility]; [facility] thyroid [facility] but release could be different due to manufacturer change. Adam Weber request cb.

## 2017-08-27 MED ORDER — LEVOTHYROXINE SODIUM 112 MCG PO TABS: 112.0000 ug | ORAL_TABLET | Freq: Every day | ORAL | 1 refills | Status: DC

## 2017-08-27 NOTE — Telephone Encounter (Signed)
Dolly called me back and said the [place] brand is on national back order and none of the pharmacies have it. [facility] and they advise me of the same thing, they said that they will put the Rx on hold so pt can get it at which ever [facility] he wants but it will have to be the new manufacture they use.   Called pt to advise him of this and no answer and pt's voicemail box was full so I couldn't leave him a VM

## 2017-08-27 NOTE — Telephone Encounter (Signed)
Spoke with pt and he wanted me to [facility] because he thinks they have the correct Rx he normally uses. Rx [facility] [address] And [facility], and I called both pharmacies and let them know what was going on

## 2017-09-24 ENCOUNTER — Other Ambulatory Visit (INDEPENDENT_AMBULATORY_CARE_PROVIDER_SITE_OTHER): Payer: BC Managed Care – PPO

## 2017-09-24 ENCOUNTER — Other Ambulatory Visit: Payer: Self-pay | Admitting: Family Medicine

## 2017-09-24 DIAGNOSIS — E781 Pure hyperglyceridemia: Secondary | ICD-10-CM

## 2017-09-24 DIAGNOSIS — E039 Hypothyroidism, unspecified: Secondary | ICD-10-CM

## 2017-09-24 LAB — COMPREHENSIVE METABOLIC PANEL
ALT: 16 U/L (ref 0–53)
AST: 16 U/L (ref 0–37)
Albumin: 4.4 g/dL (ref 3.5–5.2)
Alkaline Phosphatase: 52 U/L (ref 39–117)
BUN: 12 mg/dL (ref 6–23)
CHLORIDE: 105 meq/L (ref 96–112)
CO2: 28 meq/L (ref 19–32)
CREATININE: 1.09 mg/dL (ref 0.40–1.50)
Calcium: 9 mg/dL (ref 8.4–10.5)
GFR: 79.67 mL/min (ref >60.00)
GLUCOSE: 96 mg/dL (ref 70–99)
POTASSIUM: 4.3 meq/L (ref 3.5–5.1)
SODIUM: 138 meq/L (ref 135–145)
Total Bilirubin: 0.6 mg/dL (ref 0.2–1.2)
Total Protein: 6.6 g/dL (ref 6.0–8.3)

## 2017-09-24 LAB — LIPID PANEL
CHOLESTEROL: 177 mg/dL (ref 0–200)
HDL: 32.6 mg/dL — ABNORMAL LOW (ref >39.00)
LDL CALC: 113 mg/dL — AB (ref 0–99)
NonHDL: 144.81
TRIGLYCERIDES: 159 mg/dL — AB (ref 0.0–149.0)
Total CHOL/HDL Ratio: 5
VLDL: 31.8 mg/dL (ref 0.0–40.0)

## 2017-09-24 LAB — TSH: TSH: 1.58 u[IU]/mL (ref 0.35–4.50)

## 2017-09-24 NOTE — Telephone Encounter (Signed)
Rx sent, TSH level normal

## 2017-09-24 NOTE — Telephone Encounter (Signed)
Will wait until pt's labs come back from today to make sure his TSH level is in normal range

## 2017-10-23 ENCOUNTER — Ambulatory Visit: Payer: BC Managed Care – PPO | Admitting: Adult Health

## 2017-10-23 ENCOUNTER — Encounter: Payer: Self-pay | Admitting: Adult Health

## 2017-10-23 VITALS — BP 126/76 | Temp 99.5°F | Wt 228.0 lb

## 2017-10-23 DIAGNOSIS — J020 Streptococcal pharyngitis: Secondary | ICD-10-CM | POA: Diagnosis not present

## 2017-10-23 LAB — POCT RAPID STREP A (OFFICE): Rapid Strep A Screen: POSITIVE — AB

## 2017-10-23 MED ORDER — METHYLPREDNISOLONE ACETATE 40 MG/ML IJ SUSP
40.0000 mg | Freq: Once | INTRAMUSCULAR | Status: AC
  Administered 2017-10-23: 40 mg via INTRAMUSCULAR

## 2017-10-23 MED ORDER — PENICILLIN G BENZATHINE 1200000 UNIT/2ML IM SUSP
1.2000 10*6.[IU] | Freq: Once | INTRAMUSCULAR | Status: AC
  Administered 2017-10-23: 1.2 10*6.[IU] via INTRAMUSCULAR

## 2017-10-23 NOTE — Progress Notes (Signed)
Subjective:    Patient ID: Adam Weber, male    DOB: 10/09/1976, 41 y.o.   MRN: [id number]  Sore Throat   This is a new problem. The current episode started yesterday. The problem has been gradually worsening. Neither side of throat is experiencing more pain than the other. Maximum temperature: subjective  The fever has been present for less than 1 day. Associated symptoms include swollen glands and trouble swallowing. Pertinent negatives include no coughing. He has had exposure to strep. He has tried NSAIDs for the symptoms. The treatment provided mild relief.   He is a [employer] teacher   Baseline large tonsils.    Review of Systems  Constitutional: Negative.   HENT: Positive for sore throat and trouble swallowing. Negative for sinus pressure, sinus pain and voice change.   Respiratory: Negative for cough.   All other systems reviewed and are negative.  Past Medical History:  Diagnosis Date  . History of chicken pox   . History of hyperlipidemia   . Thyroid disease     Social History   Socioeconomic History  . Marital status: Married    Spouse name: Not on file  . Number of children: Not on file  . Years of education: Not on file  . Highest education level: Not on file  Social Needs  . Financial resource strain: Not on file  . Food insecurity - worry: Not on file  . Food insecurity - inability: Not on file  . Transportation needs - medical: Not on file  . Transportation needs - non-medical: Not on file  Occupational History  . Not on file  Tobacco Use  . Smoking status: Never Smoker  . Smokeless tobacco: Never Used  Substance and Sexual Activity  . Alcohol use: Yes    Alcohol/week: 0.0 oz    Comment: occ  . Drug use: No  . Sexual activity: Not on file  Other Topics Concern  . Not on file  Social History Narrative  . Not on file    Past Surgical History:  Procedure Laterality Date  . APPENDECTOMY  2011  . HAND SURGERY  1997    Family  History  Problem Relation Age of Onset  . Cancer - Lung Paternal Grandfather   . Hyperlipidemia Maternal Grandmother   . Stroke Maternal Grandmother   . Hypertension Maternal Grandmother   . Diabetes Paternal Grandmother        well controlled with good diet    No Known Allergies  Current Outpatient Medications on File Prior to Visit  Medication Sig Dispense Refill  . fenofibrate 54 MG tablet TAKE 1 TABLET (54 MG TOTAL) BY MOUTH DAILY. 90 tablet 2  . levothyroxine (SYNTHROID, LEVOTHROID) 112 MCG tablet TAKE 1 TABLET (112 MCG TOTAL) BY MOUTH DAILY BEFORE BREAKFAST. 30 tablet 5   No current facility-administered medications on file prior to visit.     BP 126/76 (BP Location: Left Arm)   Temp 99.5 F (37.5 C) (Oral)   Wt 228 lb (103.4 kg)   BMI 30.71 kg/m       Objective:   Physical Exam  Constitutional: He is oriented to person, place, and time. He appears well-developed and well-nourished. No distress.  HENT:  Mouth/Throat: Uvula is midline and mucous membranes are normal. Oropharyngeal exudate, posterior oropharyngeal edema and posterior oropharyngeal erythema present.  Cardiovascular: Normal rate, regular rhythm, normal heart sounds and intact distal pulses. Exam reveals no gallop and no friction rub.  No murmur heard. Pulmonary/Chest:  Effort normal and breath sounds normal. No respiratory distress. He has no wheezes. He has no rales. He exhibits no tenderness.  Neurological: He is alert and oriented to person, place, and time.  Skin: Skin is warm and dry. No rash noted. He is not diaphoretic. No erythema. No pallor.  Psychiatric: He has a normal mood and affect. His behavior is normal. Thought content normal.  Nursing note and vitals reviewed.     Assessment & Plan:  1. Strep throat - POC Rapid Strep A- positive  - methylPREDNISolone acetate (DEPO-MEDROL) injection 40 mg - penicillin g benzathine (BICILLIN LA) 1200000 UNIT/2ML injection 1.2 Million Units - Motrin for  symptom relief  - Follow up if no improvement in the next 2-3 days   Shirline Freesory Flannery Cavallero, NP

## 2018-05-05 ENCOUNTER — Other Ambulatory Visit: Payer: Self-pay | Admitting: Family Medicine

## 2018-05-18 ENCOUNTER — Telehealth: Payer: Self-pay | Admitting: Family Medicine

## 2018-05-18 NOTE — Telephone Encounter (Signed)
Copied from CRM [phone]. Topic: Quick Communication - Rx Refill/Question >> May 18, 2018 11:41 AM Marylen PontoMcneil, Ja-Kwan wrote: Medication: fenofibrate 54 MG tablet  Preferred Pharmacy (with phone number or street name): [id number] - [place],  [place] - [address] [phone] (Phone) [phone] (Fax)   Agent: Please be advised that RX refills may take up to 3 business days. We ask that you follow-up with your pharmacy.

## 2018-05-19 MED ORDER — FENOFIBRATE 54 MG PO TABS: 54.0000 mg | ORAL_TABLET | Freq: Every day | ORAL | 0 refills | Status: DC

## 2018-05-19 NOTE — Telephone Encounter (Signed)
LOV 06-24-2017 / Next appointment scheduled for 05-24-18 / Refill request for Fenofibrate / Last written:  Disp Refills Start End   fenofibrate 54 MG tablet 90 tablet 2 06/30/2017    Sig - Route: TAKE 1 TABLET (54 MG TOTAL) BY MOUTH DAILY. - Oral   / Will refill for 30 day supply.  Patient scheduled to see provider on 05-24-18

## 2018-05-24 ENCOUNTER — Ambulatory Visit: Payer: BC Managed Care – PPO | Admitting: Family Medicine

## 2018-05-28 ENCOUNTER — Ambulatory Visit: Payer: BC Managed Care – PPO | Admitting: Family Medicine

## 2018-05-28 ENCOUNTER — Encounter: Payer: Self-pay | Admitting: Family Medicine

## 2018-05-28 VITALS — BP 126/82 | HR 58 | Temp 97.9°F | Ht 72.25 in | Wt 237.8 lb

## 2018-05-28 DIAGNOSIS — Z6832 Body mass index (BMI) 32.0-32.9, adult: Secondary | ICD-10-CM

## 2018-05-28 DIAGNOSIS — E6609 Other obesity due to excess calories: Secondary | ICD-10-CM

## 2018-05-28 DIAGNOSIS — E039 Hypothyroidism, unspecified: Secondary | ICD-10-CM | POA: Diagnosis not present

## 2018-05-28 DIAGNOSIS — E781 Pure hyperglyceridemia: Secondary | ICD-10-CM | POA: Diagnosis not present

## 2018-05-28 MED ORDER — LEVOTHYROXINE SODIUM 112 MCG PO TABS: 112.0000 ug | ORAL_TABLET | Freq: Every day | ORAL | 11 refills | Status: DC

## 2018-05-28 MED ORDER — FENOFIBRATE 54 MG PO TABS
54.0000 mg | ORAL_TABLET | Freq: Every day | ORAL | 11 refills | Status: DC
Start: 2018-05-28 — End: 2019-03-14

## 2018-05-28 NOTE — Patient Instructions (Addendum)
For cholesterol   Avoid red meat/ fried foods/ egg yolks/ fatty breakfast meats/ butter, cheese and high fat dairy/ and shellfish    Take care of yourself  Use sun protection   Schedule labs on the way out

## 2018-05-28 NOTE — Progress Notes (Signed)
Subjective:    Patient ID: Adam Weber, male    DOB: 08/01/1976, 42 y.o.   MRN: [id number]  HPI Here for f/u of chronic health problems   Just finished up school  Working  Planning time with the kids this summer- traveling   Wt Readings from Last 3 Encounters:  05/28/18 237 lb 12 oz (107.8 kg)  10/23/17 228 lb (103.4 kg)  06/24/17 234 lb 12 oz (106.5 kg)  self care  Did gain some weight (at home has not changed)  In good shape/ active job/a lot of walking  Eats healthy / did cut back on sweet tea  32.02 kg/m   HIV screening Not high risk - does not desire screening     Hypothyroidism  Pt has no clinical changes at all  levothy 112 mcg  No change in energy level/ hair or skin/ edema and no tremor Lab Results  Component Value Date   TSH 1.58 09/24/2017    If tired - is from schedule    Blood pressure BP Readings from Last 3 Encounters:  05/28/18 126/82  10/23/17 126/76  06/24/17 132/86    Hyperlipidemia/trig Lab Results  Component Value Date   CHOL 177 09/24/2017   HDL 32.60 (L) 09/24/2017   LDLCALC 113 (H) 09/24/2017   LDLDIRECT 86.0 09/15/2016   TRIG 159.0 (H) 09/24/2017   CHOLHDL 5 09/24/2017   Fenofibrate 54 mg  Has not had cholesterol medicine for a week because he is out  Needs to get back on it and then check labs   Eating better overall  More salads  Less junk food  Does eat cheese  Patient Active Problem List   Diagnosis Date Noted  . Obesity 11/25/2015  . Elevated BP 12/27/2014  . IBS (irritable bowel syndrome) 12/27/2014  . Seasonal allergies 06/01/2013  . 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 Hypothyroidism 05/17/2013   Past Medical History:  Diagnosis Date  . History of chicken pox   . History of hyperlipidemia   . Thyroid disease    Past Surgical History:  Procedure Laterality Date  . APPENDECTOMY  2011  . HAND SURGERY  1997   Social History   Tobacco Use  . Smoking status: Never Smoker  . Smokeless tobacco: Never  Used  Substance Use Topics  . Alcohol use: Yes    Alcohol/week: 0.0 oz    Comment: occ  . Drug use: No   Family History  Problem Relation Age of Onset  . Cancer - Lung Paternal Grandfather   . Hyperlipidemia Maternal Grandmother   . Stroke Maternal Grandmother   . Hypertension Maternal Grandmother   . Diabetes Paternal Grandmother        well controlled with good diet   No Known Allergies No current outpatient medications on file prior to visit.   No current facility-administered medications on file prior to visit.     Review of Systems  Constitutional: Negative for activity change, appetite change, fatigue, fever and unexpected weight change.  HENT: Negative for congestion, rhinorrhea, sore throat and trouble swallowing.   Eyes: Negative for pain, redness, itching and visual disturbance.  Respiratory: Negative for cough, chest tightness, shortness of breath and wheezing.   Cardiovascular: Negative for chest pain and palpitations.  Gastrointestinal: Negative for abdominal pain, blood in stool, constipation, diarrhea and nausea.  Endocrine: Negative for cold intolerance, heat intolerance, polydipsia and polyuria.  Genitourinary: Negative for difficulty urinating, dysuria, frequency and urgency.  Musculoskeletal: Negative for arthralgias, joint swelling and myalgias.  Skin: Negative for pallor and rash.  Neurological: Negative for dizziness, tremors, weakness, numbness and headaches.  Hematological: Negative for adenopathy. Does not bruise/bleed easily.  Psychiatric/Behavioral: Negative for decreased concentration and dysphoric mood. The patient is not nervous/anxious.        Objective:   Physical Exam  Constitutional: He appears well-developed and well-nourished. No distress.  overwt and well appearing  HENT:  Head: Normocephalic and atraumatic.  Mouth/Throat: Oropharynx is clear and moist.  Eyes: Pupils are equal, round, and reactive to light. Conjunctivae and EOM are  normal.  Neck: Normal range of motion. Neck supple. No JVD present. Carotid bruit is not present. No thyromegaly present.  No thyroid changes   Cardiovascular: Normal rate, regular rhythm, normal heart sounds and intact distal pulses. Exam reveals no gallop.  Pulmonary/Chest: Effort normal and breath sounds normal. No stridor. No respiratory distress. He has no wheezes. He has no rales. He exhibits no tenderness.  No crackles  Abdominal: Soft. Bowel sounds are normal. He exhibits no distension, no abdominal bruit and no mass. There is no tenderness.  Musculoskeletal: He exhibits no edema.  Lymphadenopathy:    He has no cervical adenopathy.  Neurological: He is alert. He has normal reflexes. He displays normal reflexes. No cranial nerve deficit. Coordination normal.  Skin: Skin is warm and dry. No rash noted. No pallor.  Psychiatric: He has a normal mood and affect.  pleasant          Assessment & Plan:   Problem List Items Addressed This Visit      Endocrine   Hypothyroidism - Primary    Hypothyroidism  Pt has no clinical changes No change in energy level/ hair or skin/ edema and no tremor Lab Results  Component Value Date   TSH 1.58 09/24/2017     Due for tsh- will plan that at lab appt upcoming  Refilled levothy 112      Relevant Medications   levothyroxine (SYNTHROID, LEVOTHROID) 112 MCG tablet   Other Relevant Orders   TSH     Other   Hypertriglyceridemia    Disc goals for lipids and reasons to control them Rev last labs with pt Rev low sat fat diet in detail Has missed multiple fenofibrate doses Refilled it  Plan fasting lipid profile for 1 mo      Relevant Medications   fenofibrate 54 MG tablet   Other Relevant Orders   Comprehensive metabolic panel   Lipid panel   Obesity    Discussed how this problem influences overall health and the risks it imposes  Reviewed plan for weight loss with lower calorie diet (via better food choices and also portion  control or program like weight watchers) and exercise building up to or more than 30 minutes 5 days per week including some aerobic activity

## 2018-05-29 NOTE — Assessment & Plan Note (Signed)
Disc goals for lipids and reasons to control them Rev last labs with pt Rev low sat fat diet in detail Has missed multiple fenofibrate doses Refilled it  Plan fasting lipid profile for 1 mo

## 2018-05-29 NOTE — Assessment & Plan Note (Signed)
Hypothyroidism  Pt has no clinical changes No change in energy level/ hair or skin/ edema and no tremor Lab Results  Component Value Date   TSH 1.58 09/24/2017     Due for tsh- will plan that at lab appt upcoming  Refilled levothy 112

## 2018-05-29 NOTE — Assessment & Plan Note (Signed)
Discussed how this problem influences overall health and the risks it imposes  Reviewed plan for weight loss with lower calorie diet (via better food choices and also portion control or program like weight watchers) and exercise building up to or more than 30 minutes 5 days per week including some aerobic activity    

## 2018-06-27 ENCOUNTER — Telehealth: Payer: Self-pay | Admitting: Family Medicine

## 2018-06-27 DIAGNOSIS — E781 Pure hyperglyceridemia: Secondary | ICD-10-CM

## 2018-06-27 DIAGNOSIS — E039 Hypothyroidism, unspecified: Secondary | ICD-10-CM

## 2018-06-27 NOTE — Telephone Encounter (Signed)
-----   Message from Wendi MayaLauren Greeson, RT sent at 06/22/2018  4:40 PM EDT ----- Regarding: Lab orders for Wed 06/30/18 Please enter lab orders for 31month follow-up labs. Thanks-Lauren

## 2018-06-30 ENCOUNTER — Other Ambulatory Visit (INDEPENDENT_AMBULATORY_CARE_PROVIDER_SITE_OTHER): Payer: BC Managed Care – PPO

## 2018-06-30 DIAGNOSIS — E039 Hypothyroidism, unspecified: Secondary | ICD-10-CM | POA: Diagnosis not present

## 2018-06-30 DIAGNOSIS — E781 Pure hyperglyceridemia: Secondary | ICD-10-CM | POA: Diagnosis not present

## 2018-06-30 LAB — COMPREHENSIVE METABOLIC PANEL
ALK PHOS: 47 U/L (ref 39–117)
ALT: 28 U/L (ref 0–53)
AST: 21 U/L (ref 0–37)
Albumin: 4 g/dL (ref 3.5–5.2)
BUN: 15 mg/dL (ref 6–23)
CHLORIDE: 108 meq/L (ref 96–112)
CO2: 27 mEq/L (ref 19–32)
Calcium: 8.8 mg/dL (ref 8.4–10.5)
Creatinine, Ser: 1.27 mg/dL (ref 0.40–1.50)
GFR: 66.53 mL/min (ref >60.00)
GLUCOSE: 100 mg/dL — AB (ref 70–99)
POTASSIUM: 4.3 meq/L (ref 3.5–5.1)
SODIUM: 143 meq/L (ref 135–145)
TOTAL PROTEIN: 6.3 g/dL (ref 6.0–8.3)
Total Bilirubin: 0.6 mg/dL (ref 0.2–1.2)

## 2018-06-30 LAB — LIPID PANEL
Cholesterol: 183 mg/dL (ref 0–200)
HDL: 33.2 mg/dL — AB (ref >39.00)
NONHDL: 150.06
TRIGLYCERIDES: 206 mg/dL — AB (ref 0.0–149.0)
Total CHOL/HDL Ratio: 6
VLDL: 41.2 mg/dL — AB (ref 0.0–40.0)

## 2018-06-30 LAB — LDL CHOLESTEROL, DIRECT: Direct LDL: 116 mg/dL

## 2018-06-30 LAB — TSH: TSH: 4.68 u[IU]/mL — ABNORMAL HIGH (ref 0.35–4.50)

## 2019-01-18 ENCOUNTER — Telehealth: Payer: Self-pay

## 2019-01-18 NOTE — Telephone Encounter (Signed)
Pt left a message on triage VM stating he was nauseous. 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 He was asking if he needed to come in and if nausea was a symptom of the flu.  I called him back and advised him it could be a symptom of multiple things. He could have a stomach virus or something else. He has no other flu-like symptoms. He will call and schedule an appt if he starts to feel worse.

## 2019-03-12 ENCOUNTER — Other Ambulatory Visit: Payer: Self-pay | Admitting: Family Medicine

## 2019-05-26 ENCOUNTER — Other Ambulatory Visit: Payer: Self-pay | Admitting: Family Medicine

## 2019-07-31 ENCOUNTER — Telehealth: Payer: Self-pay | Admitting: Family Medicine

## 2019-07-31 DIAGNOSIS — E039 Hypothyroidism, unspecified: Secondary | ICD-10-CM

## 2019-07-31 DIAGNOSIS — E781 Pure hyperglyceridemia: Secondary | ICD-10-CM

## 2019-07-31 DIAGNOSIS — Z0000 Encounter for general adult medical examination without abnormal findings: Secondary | ICD-10-CM

## 2019-07-31 NOTE — Telephone Encounter (Signed)
-----   Message from Ellamae Sia sent at 07/25/2019 12:39 PM EDT ----- Regarding: Lab orders for Monday 8.17.20 Patient is scheduled for CPX labs, please order future labs, Thanks , Karna Christmas

## 2019-08-01 ENCOUNTER — Other Ambulatory Visit: Payer: BC Managed Care – PPO

## 2019-08-03 ENCOUNTER — Other Ambulatory Visit (INDEPENDENT_AMBULATORY_CARE_PROVIDER_SITE_OTHER): Payer: BC Managed Care – PPO

## 2019-08-03 ENCOUNTER — Telehealth: Payer: Self-pay

## 2019-08-03 ENCOUNTER — Other Ambulatory Visit: Payer: Self-pay

## 2019-08-03 DIAGNOSIS — Z0000 Encounter for general adult medical examination without abnormal findings: Secondary | ICD-10-CM

## 2019-08-03 DIAGNOSIS — E781 Pure hyperglyceridemia: Secondary | ICD-10-CM

## 2019-08-03 DIAGNOSIS — E039 Hypothyroidism, unspecified: Secondary | ICD-10-CM | POA: Diagnosis not present

## 2019-08-03 LAB — LIPID PANEL
Cholesterol: 189 mg/dL (ref 0–200)
HDL: 37.3 mg/dL — ABNORMAL LOW (ref >39.00)
LDL Cholesterol: 113 mg/dL — ABNORMAL HIGH (ref 0–99)
NonHDL: 151.87
Total CHOL/HDL Ratio: 5
Triglycerides: 194 mg/dL — ABNORMAL HIGH (ref 0.0–149.0)
VLDL: 38.8 mg/dL (ref 0.0–40.0)

## 2019-08-03 LAB — CBC WITH DIFFERENTIAL/PLATELET
Basophils Absolute: 0 10*3/uL (ref 0.0–0.1)
Basophils Relative: 0.6 % (ref 0.0–3.0)
Eosinophils Absolute: 0.2 10*3/uL (ref 0.0–0.7)
Eosinophils Relative: 2.7 % (ref 0.0–5.0)
HCT: 45.8 % (ref 39.0–52.0)
Hemoglobin: 15.6 g/dL (ref 13.0–17.0)
Lymphocytes Relative: 36.8 % (ref 12.0–46.0)
Lymphs Abs: 2.6 10*3/uL (ref 0.7–4.0)
MCHC: 34.1 g/dL (ref 30.0–36.0)
MCV: 87.3 fl (ref 78.0–100.0)
Monocytes Absolute: 0.5 10*3/uL (ref 0.1–1.0)
Monocytes Relative: 7.1 % (ref 3.0–12.0)
Neutro Abs: 3.7 10*3/uL (ref 1.4–7.7)
Neutrophils Relative %: 52.8 % (ref 43.0–77.0)
Platelets: 253 10*3/uL (ref 150.0–400.0)
RBC: 5.25 Mil/uL (ref 4.22–5.81)
RDW: 12.4 % (ref 11.5–15.5)
WBC: 7.1 10*3/uL (ref 4.0–10.5)

## 2019-08-03 LAB — COMPREHENSIVE METABOLIC PANEL
ALT: 22 U/L (ref 0–53)
AST: 16 U/L (ref 0–37)
Albumin: 4.4 g/dL (ref 3.5–5.2)
Alkaline Phosphatase: 55 U/L (ref 39–117)
BUN: 14 mg/dL (ref 6–23)
CO2: 26 mEq/L (ref 19–32)
Calcium: 9.2 mg/dL (ref 8.4–10.5)
Chloride: 107 mEq/L (ref 96–112)
Creatinine, Ser: 1.15 mg/dL (ref 0.40–1.50)
GFR: 69.81 mL/min (ref >60.00)
Glucose, Bld: 96 mg/dL (ref 70–99)
Potassium: 4.5 mEq/L (ref 3.5–5.1)
Sodium: 141 mEq/L (ref 135–145)
Total Bilirubin: 0.5 mg/dL (ref 0.2–1.2)
Total Protein: 6.4 g/dL (ref 6.0–8.3)

## 2019-08-03 LAB — TSH: TSH: 2.99 u[IU]/mL (ref 0.35–4.50)

## 2019-08-03 NOTE — Telephone Encounter (Signed)
Per pts lab tab the labs have been collected.

## 2019-08-03 NOTE — Telephone Encounter (Signed)
[place] Night - Client Nonclinical Telephone Record AccessNurse Client [place] [facility] - Client Client Site [place] Physician Loura Pardon - MD Contact Type Call Who Is Calling Patient / Member / Family / Caregiver Caller Name Rafi Kenneth Caller Phone Number [phone] Patient Name Adam Weber Patient DOB 12-29-1975 Call Type Message Only Information Provided Reason for Call Request for General Office Information Initial Comment Caller is at the office for his lab work. Additional Comment Office hours were provided. Call Closed By: Jaclyn Prime Transaction Date/Time: 08/03/2019 7:31:28 AM (ET)

## 2019-08-08 ENCOUNTER — Other Ambulatory Visit: Payer: Self-pay

## 2019-08-08 ENCOUNTER — Encounter: Payer: Self-pay | Admitting: Family Medicine

## 2019-08-08 ENCOUNTER — Ambulatory Visit (INDEPENDENT_AMBULATORY_CARE_PROVIDER_SITE_OTHER): Payer: BC Managed Care – PPO | Admitting: Family Medicine

## 2019-08-08 VITALS — BP 130/80 | HR 56 | Temp 97.9°F | Ht 72.25 in | Wt 233.6 lb

## 2019-08-08 DIAGNOSIS — E6609 Other obesity due to excess calories: Secondary | ICD-10-CM

## 2019-08-08 DIAGNOSIS — Z0000 Encounter for general adult medical examination without abnormal findings: Secondary | ICD-10-CM

## 2019-08-08 DIAGNOSIS — E781 Pure hyperglyceridemia: Secondary | ICD-10-CM

## 2019-08-08 DIAGNOSIS — Z6831 Body mass index (BMI) 31.0-31.9, adult: Secondary | ICD-10-CM

## 2019-08-08 DIAGNOSIS — E039 Hypothyroidism, unspecified: Secondary | ICD-10-CM

## 2019-08-08 MED ORDER — FENOFIBRATE 54 MG PO TABS: 54.0000 mg | ORAL_TABLET | Freq: Every day | ORAL | 3 refills | Status: DC

## 2019-08-08 MED ORDER — LEVOTHYROXINE SODIUM 112 MCG PO TABS: 112.0000 ug | ORAL_TABLET | Freq: Every day | ORAL | 3 refills | Status: DC

## 2019-08-08 NOTE — Patient Instructions (Addendum)
For healthy diet /weight loss Try to get most of your carbohydrates from produce (with the exception of white potatoes)  Eat less bread/pasta/rice/snack foods/cereals/sweets and other items from the middle of the grocery store (processed carbs)   (I like this better than the keto diet)   For cholesterol Avoid red meat/ fried foods/ egg yolks/ fatty breakfast meats/ butter, cheese and high fat dairy/ and shellfish    Give some thought to getting a flu shot

## 2019-08-08 NOTE — Progress Notes (Signed)
Subjective:    Patient ID: Adam Weber, male    DOB: 02/23/1976, 43 y.o.   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    Weight  Wt Readings from Last 3 Encounters:  08/08/19 233 lb 9 oz (105.9 kg)  05/28/18 237 lb 12 oz (107.8 kg)  10/23/17 228 lb (103.4 kg)  trying to take care of himself  Wt is down 4 lb  Trying to walk with his wife when he can for exercise , also active lifestyle  Is eating fairly healthy /can do better  31.46 kg/m   He is drinking more water    BP Readings from Last 3 Encounters:  08/08/19 (!) 144/92  05/28/18 126/82  10/23/17 126/76  bp elevated first check  Better after sitting BP: 130/80     Pulse Readings from Last 3 Encounters:  08/08/19 (!) 56  05/28/18 (!) 58  06/24/17 (!) 52    Flu vaccine -declines (urged to reconsider)   Tdap 7/18 -up to date  Prostate health  No nocturia  No frequency or problems emptying  No prostate cancer in family   Hypothyroidism  Pt has no clinical changes No change in energy level/ hair or skin/ edema and no tremor Lab Results  Component Value Date   TSH 2.99 08/03/2019     Takes levothyroxine 112 mcg   Hypertriglyceridemia  Lab Results  Component Value Date   CHOL 189 08/03/2019   CHOL 183 06/30/2018   CHOL 177 09/24/2017   Lab Results  Component Value Date   HDL 37.30 (L) 08/03/2019   HDL 33.20 (L) 06/30/2018   HDL 32.60 (L) 09/24/2017   Lab Results  Component Value Date   LDLCALC 113 (H) 08/03/2019   LDLCALC 113 (H) 09/24/2017   LDLCALC 139 (H) 11/23/2015   Lab Results  Component Value Date   TRIG 194.0 (H) 08/03/2019   TRIG 206.0 (H) 06/30/2018   TRIG 159.0 (H) 09/24/2017   Lab Results  Component Value Date   CHOLHDL 5 08/03/2019   CHOLHDL 6 06/30/2018   CHOLHDL 5 09/24/2017   Lab Results  Component Value Date   LDLDIRECT 116.0  06/30/2018   LDLDIRECT 86.0 09/15/2016   LDLDIRECT 109.0 04/15/2016  takes fenofibrate 54 mg  Triglycerides 194-down a bit  Does not eat a lot of greasy foods  Cholesterol problems run in the family   Other labs Results for orders placed or performed in visit on 08/03/19  TSH  Result Value Ref Range   TSH 2.99 0.35 - 4.50 uIU/mL  Lipid panel  Result Value Ref Range   Cholesterol 189 0 - 200 mg/dL   Triglycerides 119.1194.0 (H) 0.0 - 149.0 mg/dL   HDL 47.8237.30 (L) >95.62>39.00 mg/dL   VLDL 13.038.8 0.0 - 86.540.0 mg/dL   LDL Cholesterol 784113 (H) 0 - 99 mg/dL   Total CHOL/HDL Ratio 5    NonHDL 151.87   Comprehensive metabolic panel  Result Value Ref Range   Sodium 141 135 - 145 mEq/L   Potassium 4.5 3.5 - 5.1 mEq/L   Chloride 107 96 - 112 mEq/L   CO2 26 19 - 32 mEq/L   Glucose, Bld 96 70 - 99 mg/dL   BUN 14 6 - 23 mg/dL   Creatinine, Ser 6.961.15 0.40 - 1.50 mg/dL   Total Bilirubin 0.5 0.2 - 1.2 mg/dL  Alkaline Phosphatase 55 39 - 117 U/L   AST 16 0 - 37 U/L   ALT 22 0 - 53 U/L   Total Protein 6.4 6.0 - 8.3 g/dL   Albumin 4.4 3.5 - 5.2 g/dL   Calcium 9.2 8.4 - 16.110.5 mg/dL   GFR 09.6069.81 >45.40>60.00 mL/min  CBC with Differential/Platelet  Result Value Ref Range   WBC 7.1 4.0 - 10.5 K/uL   RBC 5.25 4.22 - 5.81 Mil/uL   Hemoglobin 15.6 13.0 - 17.0 g/dL   HCT 98.145.8 19.139.0 - 47.852.0 %   MCV 87.3 78.0 - 100.0 fl   MCHC 34.1 30.0 - 36.0 g/dL   RDW 29.512.4 62.111.5 - 30.815.5 %   Platelets 253.0 150.0 - 400.0 K/uL   Neutrophils Relative % 52.8 43.0 - 77.0 %   Lymphocytes Relative 36.8 12.0 - 46.0 %   Monocytes Relative 7.1 3.0 - 12.0 %   Eosinophils Relative 2.7 0.0 - 5.0 %   Basophils Relative 0.6 0.0 - 3.0 %   Neutro Abs 3.7 1.4 - 7.7 K/uL   Lymphs Abs 2.6 0.7 - 4.0 K/uL   Monocytes Absolute 0.5 0.1 - 1.0 K/uL   Eosinophils Absolute 0.2 0.0 - 0.7 K/uL   Basophils Absolute 0.0 0.0 - 0.1 K/uL    Patient Active Problem List   Diagnosis Date Noted  . Routine general medical examination at a health care facility  07/31/2019  . Obesity 11/25/2015  . Elevated BP 12/27/2014  . IBS (irritable bowel syndrome) 12/27/2014  . Seasonal allergies 06/01/2013  . 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 Hypothyroidism 05/17/2013   Past Medical History:  Diagnosis Date  . History of chicken pox   . History of hyperlipidemia   . Thyroid disease    Past Surgical History:  Procedure Laterality Date  . APPENDECTOMY  2011  . HAND SURGERY  1997   Social History   Tobacco Use  . Smoking status: Never Smoker  . Smokeless tobacco: Never Used  Substance Use Topics  . Alcohol use: Yes    Alcohol/week: 0.0 standard drinks    Comment: occ  . Drug use: No   Family History  Problem Relation Age of Onset  . Cancer - Lung Paternal Grandfather   . Hyperlipidemia Maternal Grandmother   . Stroke Maternal Grandmother   . Hypertension Maternal Grandmother   . Diabetes Paternal Grandmother        well controlled with good diet   No Known Allergies No current outpatient medications on file prior to visit.   No current facility-administered medications on file prior to visit.     Review of Systems  Constitutional: Negative for activity change, appetite change, fatigue, fever and unexpected weight change.  HENT: Negative for congestion, rhinorrhea, sore throat and trouble swallowing.   Eyes: Negative for pain, redness, itching and visual disturbance.  Respiratory: Negative for cough, chest tightness, shortness of breath and wheezing.   Cardiovascular: Negative for chest pain and palpitations.  Gastrointestinal: Negative for abdominal pain, blood in stool, constipation, diarrhea and nausea.  Endocrine: Negative for cold intolerance, heat intolerance, polydipsia and polyuria.  Genitourinary: Negative for difficulty urinating, dysuria, frequency and urgency.  Musculoskeletal: Negative for arthralgias, joint swelling and myalgias.  Skin: Negative for pallor and rash.  Neurological: Negative for dizziness, tremors,  weakness, numbness and headaches.  Hematological: Negative for adenopathy. Does not bruise/bleed easily.  Psychiatric/Behavioral: Negative for decreased concentration and dysphoric mood. The patient is not nervous/anxious.        Objective:  Physical Exam Constitutional:      General: He is not in acute distress.    Appearance: Normal appearance. He is well-developed. He is not ill-appearing or diaphoretic.     Comments: overwt  Muscular build  HENT:     Head: Normocephalic and atraumatic.     Right Ear: Tympanic membrane, ear canal and external ear normal.     Left Ear: Tympanic membrane, ear canal and external ear normal.     Nose: Nose normal.     Mouth/Throat:     Mouth: Mucous membranes are moist.     Pharynx: Oropharynx is clear. No posterior oropharyngeal erythema.  Eyes:     General: No scleral icterus.       Right eye: No discharge.        Left eye: No discharge.     Conjunctiva/sclera: Conjunctivae normal.     Pupils: Pupils are equal, round, and reactive to light.  Neck:     Musculoskeletal: Normal range of motion and neck supple. No neck rigidity or muscular tenderness.     Thyroid: No thyromegaly.     Vascular: No carotid bruit or JVD.  Cardiovascular:     Rate and Rhythm: Normal rate and regular rhythm.     Pulses: Normal pulses.     Heart sounds: Normal heart sounds. No gallop.   Pulmonary:     Effort: Pulmonary effort is normal. No respiratory distress.     Breath sounds: Normal breath sounds. No wheezing.     Comments: Good air exch Chest:     Chest wall: No tenderness.  Abdominal:     General: Bowel sounds are normal. There is no distension or abdominal bruit.     Palpations: Abdomen is soft. There is no mass.     Tenderness: There is no abdominal tenderness.     Hernia: No hernia is present.  Musculoskeletal:        General: No tenderness.     Right lower leg: No edema.     Left lower leg: No edema.  Lymphadenopathy:     Cervical: No cervical  adenopathy.  Skin:    General: Skin is warm and dry.     Coloration: Skin is not pale.     Findings: No erythema or rash.     Comments: Solar lentigines diffusely   Neurological:     Mental Status: He is alert. Mental status is at baseline.     Cranial Nerves: No cranial nerve deficit.     Motor: No abnormal muscle tone.     Coordination: Coordination normal.     Gait: Gait normal.     Deep Tendon Reflexes: Reflexes are normal and symmetric. Reflexes normal.  Psychiatric:        Attention and Perception: Attention normal.        Mood and Affect: Mood normal.     Comments: pleasant           Assessment & Plan:   Problem List Items Addressed This Visit      Endocrine   Hypothyroidism    Hypothyroidism  Pt has no clinical changes No change in energy level/ hair or skin/ edema and no tremor Lab Results  Component Value Date   TSH 2.99 08/03/2019    Refilled levothy 112 mcg daily and rev how to take      Relevant Medications   levothyroxine (SYNTHROID) 112 MCG tablet     Other   Hypertriglyceridemia    Disc goals for  lipids and reasons to control them Rev last labs with pt Rev low sat fat diet in detail Triglycerides in 190s (improved) LDL stable at 113-disc goal of getting under 100 and diet changes to make      Relevant Medications   fenofibrate 54 MG tablet   Obesity    Discussed how this problem influences overall health and the risks it imposes  Reviewed plan for weight loss with lower calorie diet (via better food choices and also portion control or program like weight watchers) and exercise building up to or more than 30 minutes 5 days per week including some aerobic activity   Disc plan to cut back fatty foods and also processed carbs        Routine general medical examination at a health care facility - Primary    Reviewed health habits including diet and exercise and skin cancer prevention Reviewed appropriate screening tests for age  Also reviewed  health mt list, fam hx and immunization status , as well as social and family history   See HPI Labs rev  Flu vaccine strongly recommended -he will think about it  Enc regular exercise Disc plan for wt loss and fitness  bp improved on 2nd check

## 2019-08-08 NOTE — Assessment & Plan Note (Signed)
Reviewed health habits including diet and exercise and skin cancer prevention Reviewed appropriate screening tests for age  Also reviewed health mt list, fam hx and immunization status , as well as social and family history   See HPI Labs rev  Flu vaccine strongly recommended -he will think about it  Enc regular exercise Disc plan for wt loss and fitness  bp improved on 2nd check

## 2019-08-08 NOTE — Assessment & Plan Note (Signed)
Discussed how this problem influences overall health and the risks it imposes  Reviewed plan for weight loss with lower calorie diet (via better food choices and also portion control or program like weight watchers) and exercise building up to or more than 30 minutes 5 days per week including some aerobic activity   Disc plan to cut back fatty foods and also processed carbs

## 2019-08-08 NOTE — Assessment & Plan Note (Signed)
Disc goals for lipids and reasons to control them Rev last labs with pt Rev low sat fat diet in detail Triglycerides in 190s (improved) LDL stable at 113-disc goal of getting under 100 and diet changes to make

## 2019-08-08 NOTE — Assessment & Plan Note (Signed)
Hypothyroidism  Pt has no clinical changes No change in energy level/ hair or skin/ edema and no tremor Lab Results  Component Value Date   TSH 2.99 08/03/2019    Refilled levothy 112 mcg daily and rev how to take

## 2020-02-16 ENCOUNTER — Ambulatory Visit (INDEPENDENT_AMBULATORY_CARE_PROVIDER_SITE_OTHER): Payer: BC Managed Care – PPO | Admitting: Family Medicine

## 2020-02-16 ENCOUNTER — Encounter: Payer: Self-pay | Admitting: Family Medicine

## 2020-02-16 ENCOUNTER — Other Ambulatory Visit: Payer: Self-pay

## 2020-02-16 VITALS — BP 140/85 | HR 51

## 2020-02-16 DIAGNOSIS — R0789 Other chest pain: Secondary | ICD-10-CM | POA: Insufficient documentation

## 2020-02-16 DIAGNOSIS — R05 Cough: Secondary | ICD-10-CM | POA: Diagnosis not present

## 2020-02-16 DIAGNOSIS — R12 Heartburn: Secondary | ICD-10-CM | POA: Diagnosis not present

## 2020-02-16 DIAGNOSIS — R059 Cough, unspecified: Secondary | ICD-10-CM

## 2020-02-16 MED ORDER — FAMOTIDINE 20 MG PO TABS: 20.0000 mg | ORAL_TABLET | Freq: Two times a day (BID) | ORAL | 0 refills | Status: DC

## 2020-02-16 NOTE — Progress Notes (Signed)
Virtual Visit via Video Note  I connected with Adam Weber on 02/16/20 at 11:45 AM EST by a video enabled telemedicine application and verified that I am speaking with the correct person using two identifiers.  Location: Patient: home Provider: office   I discussed the limitations of evaluation and management by telemedicine and the availability of in person appointments. The patient expressed understanding and agreed to proceed.  Parties involved in encounter  Patient: Adam Weber  Provider:  Roxy Manns MD    History of Present Illness: Past few mo- tightness in chest (he feels it with a deep breath)    2 mo  Lying on his sides makes it worse (either side)   Feels like his LN are swollen/ under his chin and jaw -not tender  Both sides  None occipital or around ears  Has not noticed facial swelling   Has had a little scratchy throat -nothing bad  Allergies are not bad  No nasal symptoms  No ear pain   Cough- just a little  Tickle feeling  Not productive  Happens when he takes a deep breath   He does get occ heartburn  He tries to avoid the foods that worsen it  Not any worse than normal   No fever  No n/v Does not feel sick  Gets temp taken daily    A little wheezing a month ago  Nothing now   No trouble with taste or smell   No recent covid contacts   Has not had a covid vaccine yet   Patient Active Problem List   Diagnosis Date Noted  . Cough 02/16/2020  . Heartburn 02/16/2020  . Chest pressure 02/16/2020  . Routine general medical examination at a health care facility 07/31/2019  . Obesity 11/25/2015  . Elevated BP 12/27/2014  . IBS (irritable bowel syndrome) 12/27/2014  . Seasonal allergies 06/01/2013  . 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 Hypothyroidism 05/17/2013   Past Medical History:  Diagnosis Date  . History of chicken pox   . History of hyperlipidemia   . Thyroid disease    Past Surgical History:  Procedure Laterality  Date  . APPENDECTOMY  2011  . HAND SURGERY  1997   Social History   Tobacco Use  . Smoking status: Never Smoker  . Smokeless tobacco: Never Used  Substance Use Topics  . Alcohol use: Yes    Alcohol/week: 0.0 standard drinks    Comment: occ  . Drug use: No   Family History  Problem Relation Age of Onset  . Cancer - Lung Paternal Grandfather   . Hyperlipidemia Maternal Grandmother   . Stroke Maternal Grandmother   . Hypertension Maternal Grandmother   . Diabetes Paternal Grandmother        well controlled with good diet   No Known Allergies Current Outpatient Medications on File Prior to Visit  Medication Sig Dispense Refill  . fenofibrate 54 MG tablet Take 1 tablet (54 mg total) by mouth daily. 90 tablet 3  . levothyroxine (SYNTHROID) 112 MCG tablet Take 1 tablet (112 mcg total) by mouth daily before breakfast. 90 tablet 3   No current facility-administered medications on file prior to visit.    Review of Systems  Constitutional: Negative for chills, fever and malaise/fatigue.  HENT: Negative for congestion, ear pain, sinus pain and sore throat.        Scratchy throat - clearing throat  Eyes: Negative for blurred vision, discharge and redness.  Respiratory: Positive for cough. Negative for  hemoptysis, sputum production, shortness of breath, wheezing and stridor.   Cardiovascular: Negative for chest pain, palpitations and leg swelling.  Gastrointestinal: Negative for abdominal pain, diarrhea, nausea and vomiting.  Musculoskeletal: Negative for myalgias.  Skin: Negative for rash.  Neurological: Negative for dizziness and headaches.    Observations/Objective: Patient appears well, in no distress Weight is baseline  No facial swelling or asymmetry Normal voice-not hoarse and no slurred speech No obvious tremor or mobility impairment Moving neck and UEs normally Able to hear the call well  No cough or shortness of breath during interview  Talkative and mentally sharp  with no cognitive changes No skin changes on face or neck , no rash or pallor Affect is normal    Assessment and Plan: Problem List Items Addressed This Visit      Other   Cough    Mild- 2 mo with some chest pressure  Also occ throat scratchiness /heartburn and feeling of LN swollen in neck   Will get a covid test (info given) and isolate until result Also tx with pepcid given poss of GERD If neg test will arrange to see in office and examine      Heartburn    Triggered by spicy foods but worse lately  Also some chest pressure/throat symptoms and scant cough  Will send in pepcid to try 20 mg bid  If no imp schedule a visit       Chest pressure - Primary    2 mo of mid chest pressure (mild) worse when lying down (not exertional)  ? If some wheezing a month ago- not now  Scant cough (dry)  Some heartburn and scratchy throat  covid is unlikely but will plan to be tested just to rule it out  Will tx for GERd with pepcid 20 mg bid as well and report back   If neg covid and no imp will plan an office visit            Follow Up Instructions: To be cautious, I think you should get tested for covid- isolate until you get a result and please call us with the result  Try pepcid 20 mg twice daily for acid reflux- this could cause some of the chest and throat symptoms you are having  Once we see how you do we will plan an in office visit Please alert [place] if symptoms worsen or if new ones develop    I discussed the assessment and treatment plan with the patient. The patient was provided an opportunity to ask questions and all were answered. The patient agreed with the plan and demonstrated an understanding of the instructions.   The patient was advised to call back or seek an in-person evaluation if the symptoms worsen or if the condition fails to improve as anticipated.     Loura Pardon, MD

## 2020-02-16 NOTE — Patient Instructions (Signed)
To be cautious, I think you should get tested for covid- isolate until you get a result and please call us with the result  Try pepcid 20 mg twice daily for acid reflux- this could cause some of the chest and throat symptoms you are having  Once we see how you do we will plan an in office visit Please alert [place] if symptoms worsen or if new ones develop

## 2020-02-16 NOTE — Assessment & Plan Note (Signed)
2 mo of mid chest pressure (mild) worse when lying down (not exertional)  ? If some wheezing a month ago- not now  Scant cough (dry)  Some heartburn and scratchy throat  covid is unlikely but will plan to be tested just to rule it out  Will tx for GERd with pepcid 20 mg bid as well and report back   If neg covid and no imp will plan an office visit

## 2020-02-16 NOTE — Assessment & Plan Note (Signed)
Mild- 2 mo with some chest pressure  Also occ throat scratchiness /heartburn and feeling of LN swollen in neck   Will get a covid test (info given) and isolate until result Also tx with pepcid given poss of GERD If neg test will arrange to see in office and examine

## 2020-02-16 NOTE — Assessment & Plan Note (Signed)
Triggered by spicy foods but worse lately  Also some chest pressure/throat symptoms and scant cough  Will send in pepcid to try 20 mg bid  If no imp schedule a visit

## 2020-02-27 ENCOUNTER — Telehealth: Payer: Self-pay | Admitting: Family Medicine

## 2020-02-27 NOTE — Telephone Encounter (Signed)
Pt called in and said he was told to follow up with his covid test results. His covid test came back negative.

## 2020-02-27 NOTE — Telephone Encounter (Signed)
Good -thanks for getting that  We were going to try acid reflux medicine to see if that helps cough and other symptoms  If not better please schedule in office visit

## 2020-02-27 NOTE — Telephone Encounter (Signed)
Pt notified of Dr. Royden Purl comments. Pt said the meds seem to be helping some so he will give it a little more time. Pt has started having more hiccups but if that doesn't resolve with his other issues he will schedule a f/u in the office to discuss this

## 2020-03-09 ENCOUNTER — Ambulatory Visit: Payer: BC Managed Care – PPO | Attending: Internal Medicine

## 2020-03-09 ENCOUNTER — Other Ambulatory Visit: Payer: Self-pay | Admitting: Family Medicine

## 2020-03-09 DIAGNOSIS — Z23 Encounter for immunization: Secondary | ICD-10-CM

## 2020-03-09 NOTE — Progress Notes (Signed)
   Covid-19 Vaccination Clinic  Name:  Adam Weber    MRN: [id number] DOB: 09-Jan-1976  03/09/2020  [name] was observed post Covid-19 immunization for 15 minutes without incident. He was provided with Vaccine Information Sheet and instruction to access the V-Safe system.   [name] was instructed to call 911 with any severe reactions post vaccine: Marland Kitchen Difficulty breathing  . Swelling of face and throat  . A fast heartbeat  . A bad rash all over body  . Dizziness and weakness   Immunizations Administered    Name Date Dose VIS Date Route   Pfizer COVID-19 Vaccine 03/09/2020  4:16 PM 0.3 mL 11/25/2019 Intramuscular   Manufacturer: [employer], [employer]   Lot: [id number]   NDC: [address]-9

## 2020-04-04 ENCOUNTER — Ambulatory Visit: Payer: BC Managed Care – PPO | Attending: Internal Medicine

## 2020-04-04 DIAGNOSIS — Z23 Encounter for immunization: Secondary | ICD-10-CM

## 2020-04-04 NOTE — Progress Notes (Signed)
   Covid-19 Vaccination Clinic  Name:  Adam Weber    MRN: [id number] DOB: 11/25/1976  04/04/2020  Mr. Adam Weber was observed post Covid-19 immunization for    Covid-19 Vaccination Clinic  Name:  Adam Weber    MRN: [id number] DOB: 18-Jun-1976  04/04/2020  Mr. Adam Weber was observed post Covid-19 immunization for 15 minutes without incident. He was provided with Vaccine Information Sheet and instruction to access the V-Safe system.   Mr. Adam Weber was instructed to call 911 with any severe reactions post vaccine: Marland Kitchen Difficulty breathing  . Swelling of face and throat  . A fast heartbeat  . A bad rash all over body  . Dizziness and weakness   Immunizations Administered    Name Date Dose VIS Date Route   Pfizer COVID-19 Vaccine 04/04/2020  4:21 PM 0.3 mL 02/08/2019 Intramuscular   Manufacturer: [employer], [employer]   Lot: [id number]   NDC: [address]-6     without incident. He was provided with Vaccine Information Sheet and instruction to access the V-Safe system.   Mr. Adam Weber was instructed to call 911 with any severe reactions post vaccine: Marland Kitchen Difficulty breathing  . Swelling of face and throat  . A fast heartbeat  . A bad rash all over body  . Dizziness and weakness   Immunizations Administered    Name Date Dose VIS Date Route   Pfizer COVID-19 Vaccine 04/04/2020  4:21 PM 0.3 mL 02/08/2019 Intramuscular   Manufacturer: [employer], [employer]   Lot: [id number]   NDC: [address]-3

## 2020-06-05 ENCOUNTER — Other Ambulatory Visit: Payer: Self-pay | Admitting: Family Medicine

## 2020-08-23 ENCOUNTER — Other Ambulatory Visit: Payer: Self-pay | Admitting: Family Medicine

## 2020-08-23 NOTE — Telephone Encounter (Signed)
Pt hasn't has a CPE or labs in over a year and no future appts., please advise

## 2020-08-23 NOTE — Telephone Encounter (Signed)
Please schedule PE and refill until then  

## 2020-08-24 NOTE — Telephone Encounter (Signed)
Adam Weber scheduled appt and med refilled

## 2020-08-31 ENCOUNTER — Ambulatory Visit (INDEPENDENT_AMBULATORY_CARE_PROVIDER_SITE_OTHER): Payer: BC Managed Care – PPO | Admitting: Family Medicine

## 2020-08-31 ENCOUNTER — Other Ambulatory Visit: Payer: Self-pay

## 2020-08-31 ENCOUNTER — Encounter: Payer: Self-pay | Admitting: Family Medicine

## 2020-08-31 VITALS — BP 122/74 | HR 59 | Temp 98.6°F | Ht 73.0 in | Wt 231.0 lb

## 2020-08-31 DIAGNOSIS — Z0000 Encounter for general adult medical examination without abnormal findings: Secondary | ICD-10-CM

## 2020-08-31 DIAGNOSIS — E039 Hypothyroidism, unspecified: Secondary | ICD-10-CM

## 2020-08-31 DIAGNOSIS — Z9189 Other specified personal risk factors, not elsewhere classified: Secondary | ICD-10-CM

## 2020-08-31 DIAGNOSIS — Z1159 Encounter for screening for other viral diseases: Secondary | ICD-10-CM | POA: Diagnosis not present

## 2020-08-31 DIAGNOSIS — E781 Pure hyperglyceridemia: Secondary | ICD-10-CM | POA: Diagnosis not present

## 2020-08-31 DIAGNOSIS — E6609 Other obesity due to excess calories: Secondary | ICD-10-CM

## 2020-08-31 DIAGNOSIS — Z6830 Body mass index (BMI) 30.0-30.9, adult: Secondary | ICD-10-CM

## 2020-08-31 MED ORDER — LEVOTHYROXINE SODIUM 112 MCG PO TABS
112.0000 ug | ORAL_TABLET | Freq: Every day | ORAL | 0 refills | Status: DC
Start: 2020-08-31 — End: 2020-10-12

## 2020-08-31 MED ORDER — FENOFIBRATE 54 MG PO TABS
54.0000 mg | ORAL_TABLET | Freq: Every day | ORAL | 3 refills | Status: DC
Start: 2020-08-31 — End: 2021-09-11

## 2020-08-31 MED ORDER — FAMOTIDINE 20 MG PO TABS: 20.0000 mg | ORAL_TABLET | Freq: Two times a day (BID) | ORAL | 3 refills | Status: DC

## 2020-08-31 NOTE — Progress Notes (Signed)
Subjective:    Patient ID: Adam Weber, male    DOB: May 13, 1976, 44 y.o.   MRN: [id number]  This visit occurred during the SARS-CoV-2 public health emergency.  Safety protocols were in place, including screening questions prior to the visit, additional usage of staff PPE, and extensive cleaning of exam room while observing appropriate contact time as indicated for disinfecting solutions.    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, commissioning     covid vaccine -done in April  Tdap 11/18   Flu shot - declines   Hep C screen- will do today    Prostate health -no problems/symptoms  occ nocturia-rarely  No urinary stream issues  No prostate cancer in the family     Hypothyroidism  Pt has no clinical changes (is fatigued from schedule so hard to tell)  No change in energy level/ hair or skin/ edema and no tremor Lab Results  Component Value Date   TSH 2.99 08/03/2019    Due for labs today  Levothyroxine 112 mcg     Hypertriglyceridemia  Lab Results  Component Value Date   CHOL 189 08/03/2019   HDL 37.30 (L) 08/03/2019   LDLCALC 113 (H) 08/03/2019   LDLDIRECT 116.0 06/30/2018   TRIG 194.0 (H) 08/03/2019   CHOLHDL 5 08/03/2019  takes fenofibrate    Eating is fair  Has cut out sweet tea   Patient Active Problem List   Diagnosis Date Noted  . Routine general medical examination at a health care facility 07/31/2019  . Obesity 11/25/2015  . IBS (irritable bowel syndrome) 12/27/2014  . Seasonal allergies 06/01/2013  . 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 Hypothyroidism 05/17/2013   Past Medical History:    Diagnosis Date  . History of chicken pox   . History of hyperlipidemia   . Thyroid disease    Past Surgical History:  Procedure Laterality Date  . APPENDECTOMY  2011  . HAND SURGERY  1997   Social History   Tobacco Use  . Smoking status: Never Smoker  . Smokeless tobacco: Never Used  Substance Use Topics  . Alcohol use: Yes    Alcohol/week: 0.0 standard drinks    Comment: occ  . Drug use: No   Family History  Problem Relation Age of Onset  . Cancer - Lung Paternal Grandfather   . Hyperlipidemia Maternal Grandmother   . Stroke Maternal Grandmother   . Hypertension Maternal Grandmother   . Diabetes Paternal Grandmother        well controlled with good diet   No Known Allergies No current outpatient medications on file prior to visit.   No current facility-administered medications on file prior to visit.    Review of Systems  Constitutional: Negative for activity change, appetite change, fatigue, fever and unexpected weight change.       Busy schedule  HENT: Negative for congestion, rhinorrhea, sore throat and trouble swallowing.   Eyes: Negative for pain, redness, itching and visual disturbance.  Respiratory: Negative for cough, chest tightness, shortness of breath and wheezing.   Cardiovascular: Negative  for chest pain and palpitations.  Gastrointestinal: Negative for abdominal pain, blood in stool, constipation, diarrhea and nausea.  Endocrine: Negative for cold intolerance, heat intolerance, polydipsia and polyuria.  Genitourinary: Negative for difficulty urinating, dysuria, frequency and urgency.  Musculoskeletal: Negative for arthralgias, joint swelling and myalgias.  Skin: Negative for pallor and rash.  Neurological: Negative for dizziness, tremors, weakness, numbness and headaches.  Hematological: Negative for adenopathy. Does not bruise/bleed easily.  Psychiatric/Behavioral: Negative for decreased concentration and dysphoric mood. The patient is not  nervous/anxious.        Objective:   Physical Exam Constitutional:      General: He is not in acute distress.    Appearance: Normal appearance. He is well-developed. He is not ill-appearing or diaphoretic.  HENT:     Head: Normocephalic and atraumatic.     Right Ear: Tympanic membrane, ear canal and external ear normal.     Left Ear: Tympanic membrane, ear canal and external ear normal.     Nose: Nose normal. No congestion.     Mouth/Throat:     Mouth: Mucous membranes are moist.     Pharynx: Oropharynx is clear. No posterior oropharyngeal erythema.  Eyes:     General: No scleral icterus.       Right eye: No discharge.        Left eye: No discharge.     Conjunctiva/sclera: Conjunctivae normal.     Pupils: Pupils are equal, round, and reactive to light.  Neck:     Thyroid: No thyromegaly.     Vascular: No carotid bruit or JVD.  Cardiovascular:     Rate and Rhythm: Normal rate and regular rhythm.     Pulses: Normal pulses.     Heart sounds: Normal heart sounds. No gallop.   Pulmonary:     Effort: Pulmonary effort is normal. No respiratory distress.     Breath sounds: Normal breath sounds. No wheezing or rales.     Comments: Good air exch Chest:     Chest wall: No tenderness.  Abdominal:     General: Bowel sounds are normal. There is no distension or abdominal bruit.     Palpations: Abdomen is soft. There is no mass.     Tenderness: There is no abdominal tenderness.     Hernia: No hernia is present.  Musculoskeletal:        General: No tenderness.     Cervical back: Normal range of motion and neck supple. No rigidity. No muscular tenderness.     Right lower leg: No edema.     Left lower leg: No edema.  Lymphadenopathy:     Cervical: No cervical adenopathy.  Skin:    General: Skin is warm and dry.     Coloration: Skin is not pale.     Findings: No erythema or rash.     Comments: Pink nevus on R shoulder that appears scarred/resembles dermatofibroma Tanned Solar  lentigines diffusely   Neurological:     Mental Status: He is alert.     Cranial Nerves: No cranial nerve deficit.     Motor: No abnormal muscle tone.     Coordination: Coordination normal.     Gait: Gait normal.     Deep Tendon Reflexes: Reflexes are normal and symmetric. Reflexes normal.  Psychiatric:        Mood and Affect: Mood normal.        Cognition and Memory: Cognition and memory normal.     Comments: pleasant  Assessment & Plan:   Problem List Items Addressed This Visit      Endocrine   Hypothyroidism    No clinical changes  TSH today Taking levothyroxine 112 mcg daily Taking it correctly      Relevant Medications   levothyroxine (SYNTHROID) 112 MCG tablet   Other Relevant Orders   TSH (Completed)     Other   Hypertriglyceridemia    Disc goals for lipids and reasons to control them Rev last labs with pt Rev low sat fat diet in detail Labs today  Taking fenofibrate low dose 54 mg        Relevant Medications   fenofibrate 54 MG tablet   Other Relevant Orders   Lipid panel (Completed)   Obesity    Large frame- bmi may not represent true obesity Discussed how this problem influences overall health and the risks it imposes  Reviewed plan for weight loss with lower calorie diet (via better food choices and also portion control or program like weight watchers) and exercise building up to or more than 30 minutes 5 days per week including some aerobic activity         Routine general medical examination at a health care facility - Primary    Reviewed health habits including diet and exercise and skin cancer prevention Reviewed appropriate screening tests for age  Also reviewed health mt list, fam hx and immunization status , as well as social and family history   See HPI Labs ordered for wellness  covid vaccinated  Flu shot declined -questions answered  Enc more exercise and better diet  Busy schedule-little time for self care       Relevant Orders   CBC with Differential/Platelet (Completed)   Comprehensive metabolic panel (Completed)   Lipid panel (Completed)   TSH (Completed)   Encounter for hepatitis C virus screening test for high risk patient    Hep C screen today      Relevant Orders   Hepatitis C antibody

## 2020-08-31 NOTE — Patient Instructions (Signed)
Add exercise whenever you can  Wear sunscreen   See dermatology for the mole on your right shoulder   Labs today   Consider a flu shot this fall   Take care of yourself

## 2020-09-01 DIAGNOSIS — Z1159 Encounter for screening for other viral diseases: Secondary | ICD-10-CM | POA: Insufficient documentation

## 2020-09-01 NOTE — Assessment & Plan Note (Signed)
Disc goals for lipids and reasons to control them Rev last labs with pt Rev low sat fat diet in detail Labs today  Taking fenofibrate low dose 54 mg

## 2020-09-01 NOTE — Assessment & Plan Note (Signed)
Reviewed health habits including diet and exercise and skin cancer prevention Reviewed appropriate screening tests for age  Also reviewed health mt list, fam hx and immunization status , as well as social and family history   See HPI Labs ordered for wellness  covid vaccinated  Flu shot declined -questions answered  Enc more exercise and better diet  Busy schedule-little time for self care

## 2020-09-01 NOTE — Assessment & Plan Note (Signed)
No clinical changes  TSH today Taking levothyroxine 112 mcg daily Taking it correctly

## 2020-09-01 NOTE — Assessment & Plan Note (Signed)
Large frame- bmi may not represent true obesity Discussed how this problem influences overall health and the risks it imposes  Reviewed plan for weight loss with lower calorie diet (via better food choices and also portion control or program like weight watchers) and exercise building up to or more than 30 minutes 5 days per week including some aerobic activity

## 2020-09-01 NOTE — Assessment & Plan Note (Signed)
Hep C screen today 

## 2020-09-03 LAB — COMPREHENSIVE METABOLIC PANEL
AG Ratio: 2 (calc) (ref 1.0–2.5)
ALT: 20 U/L (ref 9–46)
AST: 22 U/L (ref 10–40)
Albumin: 4.5 g/dL (ref 3.6–5.1)
Alkaline phosphatase (APISO): 50 U/L (ref 36–130)
BUN: 22 mg/dL (ref 7–25)
CO2: 26 mmol/L (ref 20–32)
Calcium: 9.6 mg/dL (ref 8.6–10.3)
Chloride: 108 mmol/L (ref 98–110)
Creat: 1.23 mg/dL (ref 0.60–1.35)
Globulin: 2.2 g/dL (calc) (ref 1.9–3.7)
Glucose, Bld: 85 mg/dL (ref 65–99)
Potassium: 4.7 mmol/L (ref 3.5–5.3)
Sodium: 143 mmol/L (ref 135–146)
Total Bilirubin: 0.7 mg/dL (ref 0.2–1.2)
Total Protein: 6.7 g/dL (ref 6.1–8.1)

## 2020-09-03 LAB — CBC WITH DIFFERENTIAL/PLATELET
Absolute Monocytes: 570 cells/uL (ref 200–950)
Basophils Absolute: 47 cells/uL (ref 0–200)
Basophils Relative: 0.7 %
Eosinophils Absolute: 141 cells/uL (ref 15–500)
Eosinophils Relative: 2.1 %
HCT: 45.1 % (ref 38.5–50.0)
Hemoglobin: 15.8 g/dL (ref 13.2–17.1)
Lymphs Abs: 2801 cells/uL (ref 850–3900)
MCH: 30.5 pg (ref 27.0–33.0)
MCHC: 35 g/dL (ref 32.0–36.0)
MCV: 87.1 fL (ref 80.0–100.0)
MPV: 9.9 fL (ref 7.5–12.5)
Monocytes Relative: 8.5 %
Neutro Abs: 3142 cells/uL (ref 1500–7800)
Neutrophils Relative %: 46.9 %
Platelets: 257 10*3/uL (ref 140–400)
RBC: 5.18 10*6/uL (ref 4.20–5.80)
RDW: 12.2 % (ref 11.0–15.0)
Total Lymphocyte: 41.8 %
WBC: 6.7 10*3/uL (ref 3.8–10.8)

## 2020-09-03 LAB — LIPID PANEL
Cholesterol: 174 mg/dL (ref <200)
HDL: 39 mg/dL — ABNORMAL LOW (ref >=40)
LDL Cholesterol (Calc): 116 mg/dL (calc) — ABNORMAL HIGH
Non-HDL Cholesterol (Calc): 135 mg/dL (calc) — ABNORMAL HIGH (ref <130)
Total CHOL/HDL Ratio: 4.5 (calc) (ref <5.0)
Triglycerides: 86 mg/dL (ref <150)

## 2020-09-03 LAB — HEPATITIS C ANTIBODY
Hepatitis C Ab: NONREACTIVE
SIGNAL TO CUT-OFF: 0.01 (ref <1.00)

## 2020-09-03 LAB — TSH: TSH: 2.93 mIU/L (ref 0.40–4.50)

## 2020-09-17 ENCOUNTER — Telehealth: Payer: Self-pay | Admitting: Family Medicine

## 2020-09-17 NOTE — Telephone Encounter (Signed)
Treat symptoms Continue to isolate for 10 days or longer if symptoms persist longer than that  Fluids/rest  If sob/severe- go to ER Otherwise -can set up virtual visits if needed  Either way check on them again later in the week

## 2020-09-17 NOTE — Telephone Encounter (Signed)
Patient called in stating him and his wife, Adam Weber (DOB:09/21/1979) , both tested positive for covid on 30th and 1st. Both are having congestion, cough, headaches, bodyaches, and lowgrade fever. No SOB at this time. Patients are both seen by Dr.Tower and wondering what they should do from this point. Please advise.

## 2020-09-18 NOTE — Telephone Encounter (Signed)
There is no high quality date re: supplements like zinc with covid (not enough to recommend officially)  Good fluid intake and rest are best  Lying still or sitting for long periods of time can cause blood clots- get up an move every 30-60 minutes if able (that is my best recommendation )

## 2020-09-18 NOTE — Telephone Encounter (Signed)
Pt notified of Dr. Royden Purl comments. Pt said he is stable and he has already been doing most of the instructions Dr. Milinda Antis said. Pt said his main question is he is aware that blood clots are a potential side eff of covid and he wants to know what he should take to try and prevent that. ?? If ASA is a good idea or what OTC supplements. Pt is aware that some ppl have been d/c from hospital with supplements like zinc to help with covid and he wanted to know what Dr. Milinda Antis recommends. Pt is already taking fish oil so he wonders if that will help and also what other meds/ supplements Dr. Milinda Antis suggest

## 2020-09-18 NOTE — Telephone Encounter (Signed)
Pt notified of Dr. Tower's comments  

## 2020-10-12 ENCOUNTER — Other Ambulatory Visit: Payer: Self-pay | Admitting: Family Medicine

## 2021-02-18 ENCOUNTER — Telehealth: Payer: Self-pay

## 2021-02-18 ENCOUNTER — Encounter: Payer: Self-pay | Admitting: Family Medicine

## 2021-02-18 ENCOUNTER — Telehealth (INDEPENDENT_AMBULATORY_CARE_PROVIDER_SITE_OTHER): Payer: BC Managed Care – PPO | Admitting: Family Medicine

## 2021-02-18 DIAGNOSIS — J019 Acute sinusitis, unspecified: Secondary | ICD-10-CM | POA: Insufficient documentation

## 2021-02-18 DIAGNOSIS — J0100 Acute maxillary sinusitis, unspecified: Secondary | ICD-10-CM

## 2021-02-18 MED ORDER — AMOXICILLIN-POT CLAVULANATE 875-125 MG PO TABS: 1.0000 | ORAL_TABLET | Freq: Two times a day (BID) | ORAL | 0 refills | Status: DC

## 2021-02-18 MED ORDER — LEVOTHYROXINE SODIUM 112 MCG PO TABS
112.0000 ug | ORAL_TABLET | Freq: Every day | ORAL | 2 refills | Status: DC
Start: 2021-02-18 — End: 2021-12-06

## 2021-02-18 NOTE — Assessment & Plan Note (Signed)
With baseline congestion from allergies  Facial pressure /pain and purulent nasal drainage augmentin px  Fluids/rest /saline irritations prn  Can use afrin for 2 d if needed  Steroid ns-may try again (not helpful in the past) Pt will also do a home covid test and alert [place] [id number]  Update if not starting to improve in a week or if worsening

## 2021-02-18 NOTE — Patient Instructions (Signed)
Continue saline sinus irrigations  Afrin nasal spray is ok for 2 days maximum  Nasal steroid spray like flonase may help for allergy season Do a home covid test and alert [place] re: result  Update if not starting to improve in a week or if worsening    If severe- let us know and seek care in ER or urgent care

## 2021-02-18 NOTE — Progress Notes (Signed)
Virtual Visit via Video Note  I connected with Adam Weber on 02/18/21 at 10:00 AM EST by a video enabled telemedicine application and verified that I am speaking with the correct person using two identifiers.  Location: Patient: work Provider: office   I discussed the limitations of evaluation and management by telemedicine and the availability of in person appointments. The patient expressed understanding and agreed to proceed.  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 Acute sinusitis 02/18/2021  . Encounter for hepatitis C virus screening test for high risk patient 09/01/2020  . Routine general medical examination at a health care facility 07/31/2019  . Obesity 11/25/2015  . IBS (irritable bowel syndrome) 12/27/2014  . Seasonal allergies 06/01/2013  . 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 Hypothyroidism 05/17/2013   Past Medical History:  Diagnosis Date  . History of chicken pox   . History of hyperlipidemia   . Thyroid disease    Past Surgical History:  Procedure Laterality Date  . APPENDECTOMY  2011  . HAND SURGERY  1997    Social History   Tobacco Use  . Smoking status: Never Smoker  . Smokeless tobacco: Never Used  Substance Use Topics  . Alcohol use: Yes    Alcohol/week: 0.0 standard drinks    Comment: occ  . Drug use: No   Family History  Problem Relation Age of Onset  . Cancer - Lung Paternal Grandfather   . Hyperlipidemia Maternal Grandmother   . Stroke Maternal Grandmother   . Hypertension Maternal Grandmother   . Diabetes Paternal Grandmother        well controlled with good diet   No Known Allergies Current Outpatient Medications on File Prior to Visit  Medication Sig Dispense Refill  . famotidine (PEPCID) 20 MG tablet Take 1 tablet (20 mg total) by mouth 2 (two) times daily. 180 tablet 3  . fenofibrate 54 MG tablet Take 1 tablet (54 mg total) by mouth daily. 90 tablet 3   No current facility-administered medications on file prior to visit.   Review of Systems  Constitutional: Positive for malaise/fatigue. Negative for chills and fever.  HENT: Positive for congestion and sinus pain. Negative for ear pain and sore throat.   Eyes: Negative for blurred vision, discharge and redness.  Respiratory: Positive for cough. Negative for sputum production, shortness of breath, wheezing and stridor.   Cardiovascular: Negative for chest pain, palpitations and leg swelling.  Gastrointestinal: Negative for abdominal pain, diarrhea, nausea and vomiting.  Musculoskeletal: Negative for myalgias.  Skin: Negative  for rash.  Neurological: Positive for headaches. Negative for dizziness.    Observations/Objective: Patient appears well, in no distress Weight is baseline  No facial swelling or asymmetry Normal voice-not hoarse and no slurred speech (a little congested sounding) Clears throat occasionally No obvious tremor or mobility impairment Moving neck and UEs normally Able to hear the call well  No cough or shortness of breath during interview  Talkative and mentally sharp with no cognitive  changes No skin changes on face or neck , no rash or pallor Affect is normal    Assessment and Plan: Problem List Items Addressed This Visit      Respiratory   Acute sinusitis    With baseline congestion from allergies  Facial pressure /pain and purulent nasal drainage augmentin px  Fluids/rest /saline irritations prn  Can use afrin for 2 d if needed  Steroid ns-may try again (not helpful in the past) Pt will also do a home covid test and alert [place] [id number]  Update if not starting to improve in a week or if worsening       Relevant Medications   amoxicillin-clavulanate (AUGMENTIN) 875-125 MG tablet       Follow Up Instructions: Continue saline sinus irrigations  Afrin nasal spray is ok for 2 days maximum  Nasal steroid spray like flonase may help for allergy season Do a home covid test and alert [place] re: result  Update if not starting to improve in a week or if worsening    If severe- let us know and seek care in ER or urgent care    I discussed the assessment and treatment plan with the patient. The patient was provided an opportunity to ask questions and all were answered. The patient agreed with the plan and demonstrated an understanding of the instructions.   The patient was advised to call back or seek an in-person evaluation if the symptoms worsen or if the condition fails to improve as anticipated.     Roxy Manns, MD

## 2021-02-18 NOTE — Telephone Encounter (Signed)
[facility] Night - Client TELEPHONE ADVICE RECORD AccessNurse Patient Name: Adam Weber Gender: Male DOB: [birth year] Age: 45 Y 3 M 27 D Return Phone Number: [phone] (Primary), [phone] (Secondary) Address: [place] [address] Client [facility] Night - Client Client Site [facility] - Night Physician Tower, Idamae Schuller - MD Contact Type Call Who Is Calling Patient / Member / Family / Caregiver Call Type Triage / Clinical Relationship To Patient Self Return Phone Number [phone] (Primary) Chief Complaint Nasal Congestion Reason for Call Request to Schedule Office Appointment Initial Comment Caller asks for appt today for sinus infection sxs - sinus congestion and pressure. Willing to be seen by anyone. Translation No No Triage Reason Patient declined Nurse Assessment Nurse: Reed Pandy, RN, Amy Date/Time Adam Weber Time): 02/18/2021 8:15:44 AM Confirm and document reason for call. If symptomatic, describe symptoms. ---Caller states he just got off the phone with the office and has an appointment at 10am this morning. This nurse asked if he had any other questions for me and he declined at this time. Does the patient have any new or worsening symptoms? ---Yes Will a triage be completed? ---No Select reason for no triage. ---Patient declined Please document clinical information provided and list any resource used. ---Patient states he already has an appointment now at 10am this morning. Disp. Time Adam Weber Time) Disposition Final User 02/18/2021 8:17:23 AM Clinical Call Yes Reed Pandy, RN, Amy

## 2021-02-18 NOTE — Telephone Encounter (Signed)
Pt already has video appt with DR Milinda Antis 02/18/21 at 10 AM. Sending FYI to Dr Milinda Antis.

## 2021-02-20 NOTE — Telephone Encounter (Signed)
Pt had video visit on 02/18/21 with Dr Milinda Antis.

## 2021-07-24 ENCOUNTER — Other Ambulatory Visit: Payer: Self-pay

## 2021-07-24 ENCOUNTER — Ambulatory Visit (HOSPITAL_COMMUNITY)
Admission: EM | Admit: 2021-07-24 | Discharge: 2021-07-24 | Disposition: A | Discharge status: Home / Self Care | Payer: BC Managed Care – PPO | Attending: Student | Admitting: Student

## 2021-07-24 ENCOUNTER — Encounter (HOSPITAL_COMMUNITY): Payer: Self-pay | Admitting: *Deleted

## 2021-07-24 DIAGNOSIS — R29818 Other symptoms and signs involving the nervous system: Secondary | ICD-10-CM

## 2021-07-24 DIAGNOSIS — R0789 Other chest pain: Secondary | ICD-10-CM | POA: Diagnosis not present

## 2021-07-24 NOTE — ED Provider Notes (Signed)
[facility]    CSN: [id number] Arrival date & time: 07/24/21  1743      History   Chief Complaint No chief complaint on file.   HPI Adam Weber is a 45 y.o. male presenting with about 3 months of chest wall pain, snoring, gasping for air at night, daytime fatigue. Occ SOB with exertion. Occ dry cough. He is not a smoker. Denies URI symptoms like fevers/chills, congestion. States wife says he moves all over the bed at night and snores and gasps for air. Denies dizziness, left-sided chest pain at rest, left arm pain, let jaw pain.     HPI  Past Medical History:  Diagnosis Date   History of chicken pox    History of hyperlipidemia    Thyroid disease     Patient Active Problem List   Diagnosis Date Noted   Acute sinusitis 02/18/2021   Encounter for hepatitis C virus screening test for high risk patient 09/01/2020   Routine general medical examination at a health care facility 07/31/2019   Obesity 11/25/2015   IBS (irritable bowel syndrome) 12/27/2014   Seasonal allergies 06/01/2013   Hypertriglyceridemia 05/17/2013   Hypothyroidism 05/17/2013    Past Surgical History:  Procedure Laterality Date   APPENDECTOMY  2011   HAND SURGERY  1997       Home Medications    Prior to Admission medications   Medication Sig Start Date End Date Taking? Authorizing Provider  famotidine (PEPCID) 20 MG tablet Take 1 tablet (20 mg total) by mouth 2 (two) times daily. 08/31/20  Yes Tower, Audrie Gallus, MD  fenofibrate 54 MG tablet Take 1 tablet (54 mg total) by mouth daily. 08/31/20  Yes Tower, Audrie Gallus, MD  levothyroxine (SYNTHROID) 112 MCG tablet Take 1 tablet (112 mcg total) by mouth daily before breakfast. 02/18/21  Yes Tower, Audrie Gallus, MD  amoxicillin-clavulanate (AUGMENTIN) 875-125 MG tablet Take 1 tablet by mouth 2 (two) times daily. 02/18/21   Tower, Audrie Gallus, MD    Family History Family History  Problem Relation Age of Onset   Healthy Mother    Healthy Father     Hyperlipidemia Maternal Grandmother    Stroke Maternal Grandmother    Hypertension Maternal Grandmother    Diabetes Paternal Grandmother        well controlled with good diet   Cancer - Lung Paternal Grandfather     Social History Social History   Tobacco Use   Smoking status: Never   Smokeless tobacco: Never  Vaping Use   Vaping Use: Never used  Substance Use Topics   Alcohol use: Yes    Alcohol/week: 0.0 standard drinks    Comment: occ   Drug use: No     Allergies   Patient has no known allergies.   Review of Systems Review of Systems  Respiratory:         Shortness of breath Chest wall pain    Physical Exam Triage Vital Signs ED Triage Vitals  Enc Vitals Group     BP 07/24/21 1805 (!) 158/94     Pulse Rate 07/24/21 1805 (!) 53     Resp 07/24/21 1805 14     Temp 07/24/21 1805 99 F (37.2 C)     Temp Source 07/24/21 1805 Oral     SpO2 07/24/21 1805 100 %     Weight --      Height --      Head Circumference --      Peak Flow --  Pain Score 07/24/21 1808 1     Pain Loc --      Pain Edu? --      Excl. in GC? --    No data found.  Updated Vital Signs BP (!) 158/94   Pulse (!) 53   Temp 99 F (37.2 C) (Oral)   Resp 14   SpO2 100%   Visual Acuity Right Eye Distance:   Left Eye Distance:   Bilateral Distance:    Right Eye Near:   Left Eye Near:    Bilateral Near:     Physical Exam Vitals reviewed.  Constitutional:      Appearance: Normal appearance. He is not diaphoretic.  HENT:     Head: Normocephalic and atraumatic.     Mouth/Throat:     Mouth: Mucous membranes are moist.  Eyes:     Extraocular Movements: Extraocular movements intact.     Pupils: Pupils are equal, round, and reactive to light.  Cardiovascular:     Rate and Rhythm: Normal rate and regular rhythm.     Pulses:          Radial pulses are 2+ on the right side and 2+ on the left side.     Heart sounds: Normal heart sounds.  Pulmonary:     Effort: Pulmonary effort is  normal.     Breath sounds: Normal breath sounds.  Chest:     Chest wall: Tenderness present.     Comments: TTP R sternal border. Pain elicited with deep inspiration. Abdominal:     Palpations: Abdomen is soft.     Tenderness: There is no abdominal tenderness. There is no guarding or rebound.  Musculoskeletal:     Right lower leg: No edema.     Left lower leg: No edema.  Skin:    General: Skin is warm.     Capillary Refill: Capillary refill takes less than 2 seconds.  Neurological:     General: No focal deficit present.     Mental Status: He is alert and oriented to person, place, and time.  Psychiatric:        Mood and Affect: Mood normal.        Behavior: Behavior normal.        Thought Content: Thought content normal.        Judgment: Judgment normal.     UC Treatments / Results  Labs (all labs ordered are listed, but only abnormal results are displayed) Labs Reviewed - No data to display  EKG   Radiology No results found.  Procedures Procedures (including critical care time)  Medications Ordered in UC Medications - No data to display  Initial Impression / Assessment and Plan / UC Course  I have reviewed the triage vital signs and the nursing notes.  Pertinent labs & imaging results that were available during my care of the patient were reviewed by me and considered in my medical decision making (see chart for details).     This patient is a very pleasant 45 y.o. year old male presenting with suspected sleep apnea x3 months.   Chest wall pain is reproducible. EKG with sinus bradycardia unchanged from 2016 EKG. Denies dizziness, shortness of breath at time of visit.   Strong suspicion of sleep apnea. Pt will need to follow-up with his primary care provider for sleep study and further management.  He is in agreement and states he will call them to schedule this.  Final Clinical Impressions(s) / UC Diagnoses   Final  diagnoses:  Chest wall pain  Suspected  sleep apnea     Discharge Instructions      -Please call your primary care provider tomorrow and schedule a follow-up with them.  -I think you probably have sleep apnea based on your symptoms, this will need to be evaluated with a sleep study.  Your primary care will coordinate this, and help you acquire the proper treatment.     ED Prescriptions   None    PDMP not reviewed this encounter.   Rhys Martini, PA-C 07/24/21 1911

## 2021-07-24 NOTE — ED Triage Notes (Signed)
C/O "chest muscles would be sore" upon waking in the mornings for the past couple months.  States when taking a deep breath, chest feels "tight".  Also c/o transient feelings of SOB - not at present.  States when he takes a deep breath, it will produce a cough.  Otherwise, no cough.  Denies fevers.

## 2021-07-24 NOTE — ED Notes (Signed)
EKG and pt's CC/hx reviewed w/ S. [place].

## 2021-07-24 NOTE — Discharge Instructions (Addendum)
-  Please call your primary care provider tomorrow and schedule a follow-up with them.  -I think you probably have sleep apnea based on your symptoms, this will need to be evaluated with a sleep study.  Your primary care will coordinate this, and help you acquire the proper treatment.

## 2021-07-25 ENCOUNTER — Telehealth: Payer: Self-pay

## 2021-07-25 NOTE — Telephone Encounter (Signed)
Access nurse note received as below. Reviewing patient chart was seen at walk in on 8/10. Advised that he follow up with PCP to get Sleep study. I have called patient left message to call office to get visit set up.    [facility] - Client TELEPHONE ADVICE RECORD Access[facility] Patient Name: Adam Weber Gender: Male DOB: Aug 18, 1976 Age: 45 Y 9 M 2 D Return Phone Number: [phone] (Primary), [phone] (Secondary) Address: [phone] City/ State/ Zip: [place] [address] Client [facility] - Client Client Site [facility] - Day Physician Tower, Idamae Schuller - MD Contact Type Call Who Is Calling Patient / Member / Family / Caregiver Call Type Triage / Clinical Relationship To Patient Self Return Phone Number [phone] (Primary) Chief Complaint CHEST PAIN - pain, pressure, heaviness or tightness Reason for Call Symptomatic / Request for Health Information Initial Comment Caller states he is having chest tightness. Translation No Nurse Assessment Nurse: Louann Liv, RN, Darl Pikes Date/Time Lamount Cohen Time): 07/24/2021 5:00:45 PM Confirm and document reason for call. If symptomatic, describe symptoms. ---Caller states he is having chest tightness. He feels sore across his pectorals at night during sleep, and has some slight SOB. Had mild Covid October 2021. Currently rates chest tightness about 1/10. Denies SOB at this current moment; But when he exerts himself he feels SOB. Denies difficulty breathing. It's not pain, just tightness. Does the patient have any new or worsening symptoms? ---Yes Will a triage be completed? ---Yes Related visit to physician within the last 2 weeks? ---No Does the PT have any chronic conditions? (i.e. diabetes, asthma, this includes High risk factors for pregnancy, etc.) ---No Is this a behavioral health or substance abuse call? ---No Guidelines Guideline  Title Affirmed Question Affirmed Notes Nurse Date/Time (Eastern Time) Breathing Difficulty Long-distance travel in past month (e.g., car, bus, train, plane; with trip lasting 6 or more hours) Louann Liv, Charity fundraiser, Darl Pikes 07/24/2021 5:03:33 PM Disp. Time Lamount Cohen Time) Disposition Final User 07/24/2021 4:58:05 PM Send to Urgent Robyne Peers, Sofie Hartigan PLEASE NOTE: All timestamps contained within this report are represented as [place] Standard Time. CONFIDENTIALTY NOTICE: This fax transmission is intended only for the addressee. It contains information that is legally privileged, confidential or otherwise protected from use or disclosure. If you are not the intended recipient, you are strictly prohibited from reviewing, disclosing, copying using or disseminating any of this information or taking any action in reliance on or regarding this information. If you have received this fax in error, please notify us immediately by telephone so that we can arrange for its return to [place]. Phone: [phone], Toll-Free: [phone], Fax: [phone] Page: 2 of 2 Call Id: [id number] 07/24/2021 5:08:58 PM Go to ED Now Yes Louann Liv, RN, Helane Rima Disagree/Comply Disagree Caller Understands Yes PreDisposition Call Doctor Care Advice Given Per Guideline GO TO ED NOW: * You need to be seen in the Emergency Department. * Patient should not delay going to the emergency department. CARE ADVICE given per Breathing Difficulty (Adult) guideline. Comments User: Silvio Clayman, RN Date/Time Lamount Cohen Time): 07/24/2021 5:05:55 PM patient states they drove to [facility] in July, was in car for hours at a time. Onset of SOB about that time. User: Silvio Clayman, RN Date/Time Lamount Cohen Time): 07/24/2021 5:12:15 PM Patient states he doesn't need to go to the ED, he was just trying to schedule an appointment for tomorrow afternoon to have his chest tightness/SOB upon exertion checked out. He states he was  transferred to triage nurse  by office before they let him request an appointment. Advised pt to call first thing ini the morning to let them know he just wants an appointment and he has already spoken with triage nurse. Pt states the protocol is frustrating to him and he doesn't really understand what happened. Referrals GO TO FACILITY REFUSED

## 2021-07-25 NOTE — Telephone Encounter (Signed)
Agree with plan, thanks

## 2021-07-29 ENCOUNTER — Ambulatory Visit: Payer: BC Managed Care – PPO | Admitting: Family Medicine

## 2021-07-29 ENCOUNTER — Encounter: Payer: Self-pay | Admitting: Family Medicine

## 2021-07-29 ENCOUNTER — Other Ambulatory Visit: Payer: Self-pay

## 2021-07-29 VITALS — BP 128/70 | HR 54 | Temp 97.9°F | Ht 73.0 in | Wt 237.4 lb

## 2021-07-29 DIAGNOSIS — H938X1 Other specified disorders of right ear: Secondary | ICD-10-CM

## 2021-07-29 DIAGNOSIS — R0789 Other chest pain: Secondary | ICD-10-CM

## 2021-07-29 DIAGNOSIS — R0602 Shortness of breath: Secondary | ICD-10-CM | POA: Diagnosis not present

## 2021-07-29 DIAGNOSIS — R400 Somnolence: Secondary | ICD-10-CM

## 2021-07-29 DIAGNOSIS — R0683 Snoring: Secondary | ICD-10-CM | POA: Insufficient documentation

## 2021-07-29 NOTE — Assessment & Plan Note (Addendum)
Pt presented to UC with this on 8/10  Reviewed those records, lab results and studies in detail   Reassuring work up (possiblity of sleep apnea discussed)  Rev s/s of sleep apnea Also noting some sob on exertion more than usual  Reassuring exam today  Echocardiogram ordered to further assess heart size and function

## 2021-07-29 NOTE — Patient Instructions (Signed)
Try flonase or nasacort nasal spray daily for at least 2 weeks for ear pressure/sinus pressure   I placed a referral for  Pulmonary -to address sleep issue/snoring Echocardiogram - to assess heart function in light of the shortness of breath   Let us know if symptoms worsen Take care of yourself

## 2021-07-29 NOTE — Progress Notes (Signed)
Subjective:    Patient ID: Adam Weber, male    DOB: 1976/12/03, 45 y.o.   MRN: [id number]  Discussed how this problem influences overall health and the risks it imposes  Reviewed plan for weight loss with lower calorie diet (via better food choices and also portion control or program like weight watchers) and exercise building up to or more than 30 minutes 5 days per week including some aerobic activity   HPI Pt presents to discuss sleep apnea  Also sob on exertion  R ear- got water in on vacaiton  Wt Readings from Last 3 Encounters:  07/29/21 237 lb 6 oz (107.7 kg)  08/31/20 231 lb (104.8 kg)  08/08/19 233 lb 9 oz (105.9 kg)   31.32 kg/m  He was seen in UC on 8/10  Presented with chest wall pain as well as snoring and gasping for air at night  Noted day time fatigue  Not a smoker  Eval noted some reproducible chest pain/tenderness Unchanged EKG   When he wakes up in the am- tight in middle of chest  Also when he first takes a deep breath   More recently started snoring- 2 y  Sometimes wakes up -not feeling rested  Wakes up frequently at night also  Wife hits him when he snores She notes heavy breathing  Also breath holding  At times very hard to fall back to sleep, takes hours   No heartburn on pepcid   Has talked in sleep  No sleep walking  Thinks he still dreams - a little less   No h/o lung problems   Some sob on exertion  Had covid in oct    Stress is average  Exercise-constantly busy /no program      BP Readings from Last 3 Encounters:  07/29/21 128/70  07/24/21 (!) 158/94  08/31/20 122/74    Pulse Readings from Last 3 Encounters:  07/29/21 (!) 54  07/24/21 (!) 53  08/31/20 (!) 59   Lab Results  Component Value Date   TSH 2.93 08/31/2020   R ear- still feels full after water in it on vacation  No pain  No popping  No hearing loss, just feels funny Does tend to sleep on his sides   Patient Active Problem List   Diagnosis Date  Noted   Snoring 07/29/2021   SOB (shortness of breath) on exertion 07/29/2021   Somnolence 07/29/2021   Acute sinusitis 02/18/2021   Encounter for hepatitis C virus screening test for high risk patient 09/01/2020   Chest tightness 02/16/2020   Routine general medical examination at a health care facility 07/31/2019   Obesity 11/25/2015   IBS (irritable bowel syndrome) 12/27/2014   Seasonal allergies 06/01/2013   Hypertriglyceridemia 05/17/2013   Hypothyroidism 05/17/2013   Past Medical History:  Diagnosis Date   History of chicken pox    History of hyperlipidemia    Thyroid disease    Past Surgical History:  Procedure Laterality Date   APPENDECTOMY  2011   HAND SURGERY  1997   Social History   Tobacco Use   Smoking status: Never   Smokeless tobacco: Never  Vaping Use   Vaping Use: Never used  Substance Use Topics   Alcohol use: Yes    Alcohol/week: 0.0 standard drinks    Comment: occ   Drug use: No   Family History  Problem Relation Age of Onset   Healthy Mother    Healthy Father    Hyperlipidemia Maternal Grandmother  Stroke Maternal Grandmother    Hypertension Maternal Grandmother    Diabetes Paternal Grandmother        well controlled with good diet   Cancer - Lung Paternal Grandfather    No Known Allergies Current Outpatient Medications on File Prior to Visit  Medication Sig Dispense Refill   famotidine (PEPCID) 20 MG tablet Take 1 tablet (20 mg total) by mouth 2 (two) times daily. 180 tablet 3   fenofibrate 54 MG tablet Take 1 tablet (54 mg total) by mouth daily. 90 tablet 3   levothyroxine (SYNTHROID) 112 MCG tablet Take 1 tablet (112 mcg total) by mouth daily before breakfast. 90 tablet 2   No current facility-administered medications on file prior to visit.     Review of Systems  Constitutional:  Positive for fatigue. Negative for activity change, appetite change, fever and unexpected weight change.  HENT:  Negative for congestion, ear discharge,  ear pain, rhinorrhea, sore throat and trouble swallowing.        Ear fullness on right   Eyes:  Negative for pain, redness, itching and visual disturbance.  Respiratory:  Positive for apnea, chest tightness and shortness of breath. Negative for cough, choking and wheezing.   Cardiovascular:  Negative for chest pain and palpitations.  Gastrointestinal:  Negative for abdominal pain, blood in stool, constipation, diarrhea and nausea.  Endocrine: Negative for cold intolerance, heat intolerance, polydipsia and polyuria.  Genitourinary:  Negative for difficulty urinating, dysuria, frequency and urgency.  Musculoskeletal:  Negative for arthralgias, joint swelling and myalgias.  Skin:  Negative for pallor and rash.  Neurological:  Negative for dizziness, tremors, weakness, numbness and headaches.  Hematological:  Negative for adenopathy. Does not bruise/bleed easily.  Psychiatric/Behavioral:  Negative for decreased concentration and dysphoric mood. The patient is not nervous/anxious.       Objective:   Physical Exam Constitutional:      General: He is not in acute distress.    Appearance: Normal appearance. He is well-developed. He is obese. He is not ill-appearing.  HENT:     Head: Normocephalic and atraumatic.     Right Ear: Tympanic membrane, ear canal and external ear normal. There is no impacted cerumen.     Left Ear: Tympanic membrane, ear canal and external ear normal. There is no impacted cerumen.     Ears:     Comments: TMs are dull bilat/slightly retracted    Mouth/Throat:     Mouth: Mucous membranes are moist.     Pharynx: Oropharynx is clear. No oropharyngeal exudate or posterior oropharyngeal erythema.     Comments: Enlarged tonsils (not inflamed)  Left is larger than right Nl appearing uvula  Eyes:     General:        Right eye: No discharge.        Left eye: No discharge.     Conjunctiva/sclera: Conjunctivae normal.     Pupils: Pupils are equal, round, and reactive to  light.  Neck:     Thyroid: No thyromegaly.     Vascular: No carotid bruit or JVD.  Cardiovascular:     Rate and Rhythm: Regular rhythm. Bradycardia present.     Heart sounds: Normal heart sounds.    No gallop.  Pulmonary:     Effort: Pulmonary effort is normal. No respiratory distress.     Breath sounds: Normal breath sounds. No wheezing or rales.     Comments: No reproducible chest tenderness Chest:     Chest wall: No tenderness.  Abdominal:  General: Bowel sounds are normal. There is no distension or abdominal bruit.     Palpations: Abdomen is soft. There is no mass.     Tenderness: There is no abdominal tenderness.  Musculoskeletal:     Cervical back: Normal range of motion and neck supple.     Right lower leg: No edema.     Left lower leg: No edema.  Lymphadenopathy:     Cervical: No cervical adenopathy.  Skin:    General: Skin is warm and dry.     Coloration: Skin is not pale.     Findings: No erythema or rash.  Neurological:     Mental Status: He is alert.     Coordination: Coordination normal.     Deep Tendon Reflexes: Reflexes are normal and symmetric. Reflexes normal.  Psychiatric:        Mood and Affect: Mood normal.     Comments: Pleasant           Assessment & Plan:   Problem List Items Addressed This Visit       Nervous and Auditory   Ear fullness, right    Sensation of ear fullness ever since getting water in it on vacation  Nl exam, but TMs are dull bilaterally  Disc likely ETD Adv use of flonase daily for at least 2 weeks Update if not starting to improve in a week or if worsening          Other   Chest tightness - Primary    Pt presented to UC with this on 8/10  Reviewed those records, lab results and studies in detail   Reassuring work up (possiblity of sleep apnea discussed)  Rev s/s of sleep apnea Also noting some sob on exertion more than usual  Reassuring exam today  Echocardiogram ordered to further assess heart size and  function       Relevant Orders   ECHOCARDIOGRAM COMPLETE   Snoring    Snoring/loud for about 2 years with some witnessed apnea (wife) and day time somnolence Also chest tightness and sob with exertion (unsure if related)  Reassuring exam  Rev notes from UC on 8/10  Rev signs/symptoms of sleep apnea and risks it imposes Referral made to pulmonary for further eval and consideration of sleep study Handout given re: sleep apnea      Relevant Orders   Ambulatory referral to Pulmonology   SOB (shortness of breath) on exertion    See a/p for chest tightness Ref to pulm for eval/? Sleep apnea Also echocardiogram   inst to call if suddenly worse/ER precautions discussed       Relevant Orders   Ambulatory referral to Pulmonology   ECHOCARDIOGRAM COMPLETE   Somnolence    With loud snoring and witnessed apnea (wife)  Ref made to pulmonary for eval of likely sleep apnea       Relevant Orders   Ambulatory referral to Pulmonology

## 2021-07-29 NOTE — Assessment & Plan Note (Signed)
See a/p for chest tightness Ref to pulm for eval/? Sleep apnea Also echocardiogram   inst to call if suddenly worse/ER precautions discussed

## 2021-07-29 NOTE — Assessment & Plan Note (Signed)
Sensation of ear fullness ever since getting water in it on vacation  Nl exam, but TMs are dull bilaterally  Disc likely ETD Adv use of flonase daily for at least 2 weeks Update if not starting to improve in a week or if worsening

## 2021-07-29 NOTE — Assessment & Plan Note (Signed)
Snoring/loud for about 2 years with some witnessed apnea (wife) and day time somnolence Also chest tightness and sob with exertion (unsure if related)  Reassuring exam  Rev notes from UC on 8/10  Rev signs/symptoms of sleep apnea and risks it imposes Referral made to pulmonary for further eval and consideration of sleep study Handout given re: sleep apnea

## 2021-07-29 NOTE — Assessment & Plan Note (Signed)
With loud snoring and witnessed apnea (wife)  Ref made to pulmonary for eval of likely sleep apnea

## 2021-08-28 ENCOUNTER — Telehealth: Payer: Self-pay | Admitting: Family Medicine

## 2021-08-28 DIAGNOSIS — Z0000 Encounter for general adult medical examination without abnormal findings: Secondary | ICD-10-CM

## 2021-08-28 DIAGNOSIS — E781 Pure hyperglyceridemia: Secondary | ICD-10-CM

## 2021-08-28 DIAGNOSIS — E039 Hypothyroidism, unspecified: Secondary | ICD-10-CM

## 2021-08-28 NOTE — Telephone Encounter (Signed)
-----   Message from Terri J Walsh sent at 08/12/2021 10:20 AM EDT ----- Regarding: Lab orders for Thursday, 9.15.22 Patient is scheduled for CPX labs, please order future labs, Thanks , Terri   

## 2021-08-29 ENCOUNTER — Other Ambulatory Visit: Payer: BC Managed Care – PPO

## 2021-09-02 ENCOUNTER — Encounter: Payer: BC Managed Care – PPO | Admitting: Family Medicine

## 2021-09-04 ENCOUNTER — Encounter: Payer: BC Managed Care – PPO | Admitting: Family Medicine

## 2021-09-11 ENCOUNTER — Other Ambulatory Visit: Payer: Self-pay | Admitting: Family Medicine

## 2021-09-17 ENCOUNTER — Telehealth: Payer: Self-pay

## 2021-09-17 NOTE — Telephone Encounter (Signed)
Called an spoke with patient to ask if he has ever had a sleep study before . Patient states no. Nothing further needed.

## 2021-09-18 ENCOUNTER — Encounter: Payer: Self-pay | Admitting: Internal Medicine

## 2021-09-18 ENCOUNTER — Ambulatory Visit: Payer: BC Managed Care – PPO | Admitting: Internal Medicine

## 2021-09-18 ENCOUNTER — Other Ambulatory Visit: Payer: Self-pay

## 2021-09-18 VITALS — BP 132/80 | HR 65 | Temp 97.3°F | Ht 73.0 in | Wt 235.2 lb

## 2021-09-18 DIAGNOSIS — G4719 Other hypersomnia: Secondary | ICD-10-CM | POA: Diagnosis not present

## 2021-09-18 NOTE — Patient Instructions (Signed)
Plan for Home Sleep Test

## 2021-09-18 NOTE — Progress Notes (Signed)
Name: Adam Weber MRN: [id number] DOB: July 24, 1976     CONSULTATION DATE: 09/18/2021  REFERRING MD : Milinda Antis  CHIEF COMPLAINT: excessive daytime sleepiness  HISTORY OF PRESENT ILLNESS: Patient is seen today for problems and issues with sleep related to excessive daytime sleepiness Patient  has been having sleep problems for many years Patient has been having excessive daytime sleepiness for a long time Patient has been having extreme fatigue and tiredness, lack of energy +  very Loud snoring every night + struggling breathe at night and gasps for air   Discussed sleep data and reviewed with patient.  Encouraged proper weight management.  Discussed driving precautions and its relationship with hypersomnolence.  Discussed operating dangerous equipment and its relationship with hypersomnolence.  Discussed sleep hygiene, and benefits of a fixed sleep waked time.  The importance of getting eight or more hours of sleep discussed with patient.  Discussed limiting the use of the computer and television before bedtime.  Decrease naps during the day, so night time sleep will become enhanced.  Limit caffeine, and sleep deprivation.  HTN, stroke, and heart failure are potential risk factors.    EPWORTH SLEEP SCORE 10    PAST MEDICAL HISTORY :   has a past medical history of History of chicken pox, History of hyperlipidemia, and Thyroid disease.  has a past surgical history that includes Hand surgery (1997) and Appendectomy (2011). Prior to Admission medications   Medication Sig Start Date End Date Taking? Authorizing Provider  famotidine (PEPCID) 20 MG tablet TAKE 1 TABLET BY MOUTH TWICE A DAY 09/11/21  Yes Tower, Audrie Gallus, MD  fenofibrate 54 MG tablet TAKE 1 TABLET BY MOUTH DAILY 09/11/21  Yes Tower, Audrie Gallus, MD  levothyroxine (SYNTHROID) 112 MCG tablet Take 1 tablet (112 mcg total) by mouth daily before breakfast. 02/18/21  Yes Tower, Audrie Gallus, MD   No Known Allergies  FAMILY  HISTORY:  family history includes Cancer - Lung in his paternal grandfather; Diabetes in his paternal grandmother; Healthy in his father and mother; Hyperlipidemia in his maternal grandmother; Hypertension in his maternal grandmother; Stroke in his maternal grandmother. SOCIAL HISTORY:  reports that he has never smoked. He has never used smokeless tobacco. He reports current alcohol use. He reports that he does not use drugs.   Review of Systems:  Gen:  Denies  fever, sweats, chills weight loss  HEENT: Denies blurred vision, double vision, ear pain, eye pain, hearing loss, nose bleeds, sore throat Cardiac:  No dizziness, chest pain or heaviness, chest tightness,edema, No JVD Resp:   No cough, -sputum production, -shortness of breath,-wheezing, -hemoptysis,  Gi: Denies swallowing difficulty, stomach pain, nausea or vomiting, diarrhea, constipation, bowel incontinence Gu:  Denies bladder incontinence, burning urine Ext:   Denies Joint pain, stiffness or swelling Skin: Denies  skin rash, easy bruising or bleeding or hives Endoc:  Denies polyuria, polydipsia , polyphagia or weight change Psych:   Denies depression, insomnia or hallucinations  Other:  All other systems negative   ALL OTHER ROS ARE NEGATIVE   BP 132/80 (BP Location: Left Arm, Patient Position: Sitting, Cuff Size: Large)   Pulse 65   Temp (!) 97.3 F (36.3 C)   Ht 6\' 1"  (1.854 m)   Wt 235 lb 3.2 oz (106.7 kg)   SpO2 99%   BMI 31.03 kg/m     Physical Examination:   General Appearance: No distress  EYES PERRLA, EOM intact.   NECK Supple, No JVD Pulmonary: normal breath sounds, No  wheezing.  CardiovascularNormal S1,S2.  No m/r/g.   Abdomen: Benign, Soft, non-tender. Skin:   warm, no rashes, no ecchymosis  Extremities: normal, no cyanosis, clubbing. Neuro:without focal findings,  speech normal  PSYCHIATRIC: Mood, affect within normal limits.   ALL OTHER ROS ARE NEGATIVE      ASSESSMENT AND  PLAN SYNOPSIS   45 yo white male with signs and symptoms of OSA Patient will need HST for Definitive diagnosis   MEDICATION ADJUSTMENTS/LABS AND TESTS ORDERED: HST needed   CURRENT MEDICATIONS REVIEWED AT LENGTH WITH PATIENT TODAY   Patient  satisfied with Plan of action and management. All questions answered  Follow up 3-6 months  Total Time Spent 30 mins   Wallis Bamberg Santiago Glad, M.D.  [facility]  Medical Director [facility] Medical Director [facility] Cardio-Pulmonary Department

## 2021-09-20 NOTE — Addendum Note (Signed)
Addended by: Lavona Mound on: 09/20/2021 01:50 PM   Modules accepted: Orders

## 2021-11-13 ENCOUNTER — Ambulatory Visit: Payer: BC Managed Care – PPO

## 2021-11-13 ENCOUNTER — Other Ambulatory Visit: Payer: Self-pay

## 2021-11-13 DIAGNOSIS — G4719 Other hypersomnia: Secondary | ICD-10-CM

## 2021-11-13 DIAGNOSIS — G4733 Obstructive sleep apnea (adult) (pediatric): Secondary | ICD-10-CM

## 2021-11-22 ENCOUNTER — Telehealth (INDEPENDENT_AMBULATORY_CARE_PROVIDER_SITE_OTHER): Payer: BC Managed Care – PPO | Admitting: Nurse Practitioner

## 2021-11-22 ENCOUNTER — Other Ambulatory Visit: Payer: Self-pay

## 2021-11-22 VITALS — Wt 230.0 lb

## 2021-11-22 DIAGNOSIS — J069 Acute upper respiratory infection, unspecified: Secondary | ICD-10-CM | POA: Diagnosis not present

## 2021-11-22 MED ORDER — LEVOCETIRIZINE DIHYDROCHLORIDE 5 MG PO TABS: 5.0000 mg | ORAL_TABLET | Freq: Every evening | ORAL | 0 refills | Status: DC

## 2021-11-22 NOTE — Assessment & Plan Note (Signed)
Likely viral in nature.  Did encourage using nasal saline along with nasal steroid.  Patient states he does not do well with nasal steroids did recommend trying another 1 in class called Nasacort.  Patient was concerned because he is having left-sided bloody discharge when he does a sinus rinse in the mornings.  Did discuss possible etiologies and did discuss that nasal steroids can exacerbate the epistaxis.  We will continue to monitor and use over-the-counter treatments for symptomatic relief.  If is not improving by Wednesday or Thursday next week he will reach out and we consider antibiotic use.  Patient agreed with treatment plan

## 2021-11-22 NOTE — Progress Notes (Signed)
Patient ID: TRE SANKER, male    DOB: Nov 20, 1976, 45 y.o.   MRN: [id number]  Virtual visit completed through [employer], a video enabled telemedicine application. Due to national recommendations of social distancing due to COVID-19, a virtual visit is felt to be most appropriate for this patient at this time. Reviewed limitations, risks, security and privacy concerns of performing a virtual visit and the availability of in person appointments. I also reviewed that there may be a patient responsible charge related to this service. The patient agreed to proceed.   Patient location: home Provider location:  at [facility], office Persons participating in this virtual visit: patient, provider   If any vitals were documented, they were collected by patient at home unless specified below.    Wt 230 lb (104.3 kg)   BMI 30.34 kg/m    CC: Nasal congestion  Subjective:   HPI: Adam Weber is a 45 y.o. male presenting on 11/22/2021 for Nasal Congestion (Sx started around 11/19/21- sx got worse last night 11/21/21-Stuffy nose, some thick yellow/green mucus blowing out, some sinus pressure/pain, post nasal drip, sneezing, slight cough. No fever, no sore throat, body aches, chills. 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 Interim medical history since our last visit reviewed. Allergies and medications reviewed and updated. Outpatient Medications Prior to Visit  Medication Sig Dispense Refill   famotidine (PEPCID) 20 MG tablet TAKE 1 TABLET BY MOUTH TWICE A DAY 180 tablet 0   fenofibrate 54 MG tablet TAKE 1 TABLET BY MOUTH DAILY 90 tablet 0   levothyroxine (SYNTHROID) 112 MCG tablet Take 1 tablet  (112 mcg total) by mouth daily before breakfast. 90 tablet 2   No facility-administered medications prior to visit.     Per HPI unless specifically indicated in ROS section below Review of Systems  Constitutional:  Positive for fatigue. Negative for chills and fever.  HENT:  Positive for congestion, rhinorrhea and sinus pressure. Negative for ear discharge, ear pain and sore throat.   Respiratory:  Positive for cough (occ. thin clear looking). Negative for shortness of breath.   Cardiovascular:  Negative for chest pain.  Gastrointestinal:  Negative for abdominal pain, diarrhea, nausea and vomiting.  Musculoskeletal:  Negative for arthralgias and myalgias.  Neurological:  Negative for headaches.  Objective:  Wt 230 lb (104.3 kg)   BMI 30.34 kg/m   Wt Readings from Last 3 Encounters:  11/22/21 230 lb (104.3 kg)  09/18/21 235 lb 3.2 oz (106.7 kg)  07/29/21 237 lb 6 oz (107.7 kg)       Physical exam: Gen: alert, NAD, not ill appearing Pulm: speaks in complete sentences without increased work of breathing Psych: normal mood, normal thought content      Results for orders placed or performed in visit on 08/31/20  CBC with Differential/Platelet  Result Value Ref Range   WBC 6.7 3.8 - 10.8 Thousand/uL   RBC 5.18 4.20 - 5.80 Million/uL   Hemoglobin 15.8 13.2 - 17.1 g/dL   HCT 62.8 36.6 - 29.4 %   MCV 87.1 80.0 - 100.0 fL   MCH 30.5 27.0 - 33.0 pg   MCHC 35.0 32.0 - 36.0 g/dL   RDW 76.5 46.5 - 03.5 %   Platelets 257  140 - 400 Thousand/uL   MPV 9.9 7.5 - 12.5 fL   Neutro Abs 3,142 1,500 - 7,800 cells/uL   Lymphs Abs 2,801 850 - 3,900 cells/uL   Absolute Monocytes 570 200 - 950 cells/uL   Eosinophils Absolute 141 15 - 500 cells/uL   Basophils Absolute 47 0 - 200 cells/uL   Neutrophils Relative % 46.9 %   Total Lymphocyte 41.8 %   Monocytes Relative 8.5 %   Eosinophils Relative 2.1 %   Basophils Relative 0.7 %  Comprehensive metabolic panel  Result Value Ref Range    Glucose, Bld 85 65 - 99 mg/dL   BUN 22 7 - 25 mg/dL   Creat 4.00 8.67 - 6.19 mg/dL   BUN/Creatinine Ratio NOT APPLICABLE 6 - 22 (calc)   Sodium 143 135 - 146 mmol/L   Potassium 4.7 3.5 - 5.3 mmol/L   Chloride 108 98 - 110 mmol/L   CO2 26 20 - 32 mmol/L   Calcium 9.6 8.6 - 10.3 mg/dL   Total Protein 6.7 6.1 - 8.1 g/dL   Albumin 4.5 3.6 - 5.1 g/dL   Globulin 2.2 1.9 - 3.7 g/dL (calc)   AG Ratio 2.0 1.0 - 2.5 (calc)   Total Bilirubin 0.7 0.2 - 1.2 mg/dL   Alkaline phosphatase (APISO) 50 36 - 130 U/L   AST 22 10 - 40 U/L   ALT 20 9 - 46 U/L  Lipid panel  Result Value Ref Range   Cholesterol 174 <200 mg/dL   HDL 39 (L) > OR = 40 mg/dL   Triglycerides 86 <509 mg/dL   LDL Cholesterol (Calc) 116 (H) mg/dL (calc)   Total CHOL/HDL Ratio 4.5 <5.0 (calc)   Non-HDL Cholesterol (Calc) 135 (H) <130 mg/dL (calc)  TSH  Result Value Ref Range   TSH 2.93 0.40 - 4.50 mIU/L  Hepatitis C antibody  Result Value Ref Range   Hepatitis C Ab NON-REACTIVE NON-REACTI   SIGNAL TO CUT-OFF 0.01 <1.00   Assessment & Plan:   Problem List Items Addressed This Visit       Respiratory   Viral upper respiratory tract infection - Primary    Likely viral in nature.  Did encourage using nasal saline along with nasal steroid.  Patient states he does not do well with nasal steroids did recommend trying another 1 in class called Nasacort.  Patient was concerned because he is having left-sided bloody discharge when he does a sinus rinse in the mornings.  Did discuss possible etiologies and did discuss that nasal steroids can exacerbate the epistaxis.  We will continue to monitor and use over-the-counter treatments for symptomatic relief.  If is not improving by Wednesday or Thursday next week he will reach out and we consider antibiotic use.  Patient agreed with treatment plan      Relevant Medications   levocetirizine (XYZAL) 5 MG tablet     No orders of the defined types were placed in this encounter.  No  orders of the defined types were placed in this encounter.   I discussed the assessment and treatment plan with the patient. The patient was provided an opportunity to ask questions and all were answered. The patient agreed with the plan and demonstrated an understanding of the instructions. The patient was advised to call back or seek an in-person evaluation if the symptoms worsen or if the condition fails to improve as anticipated.  Follow up plan: No follow-ups on file.  Audria Nine, NP

## 2021-11-24 DIAGNOSIS — G4733 Obstructive sleep apnea (adult) (pediatric): Secondary | ICD-10-CM | POA: Diagnosis not present

## 2021-11-25 ENCOUNTER — Telehealth: Payer: Self-pay | Admitting: Internal Medicine

## 2021-11-25 ENCOUNTER — Other Ambulatory Visit: Payer: Self-pay | Admitting: Family Medicine

## 2021-11-25 DIAGNOSIS — G4733 Obstructive sleep apnea (adult) (pediatric): Secondary | ICD-10-CM

## 2021-11-25 MED ORDER — AMOXICILLIN-POT CLAVULANATE 875-125 MG PO TABS: 1.0000 | ORAL_TABLET | Freq: Two times a day (BID) | ORAL | 0 refills | Status: DC

## 2021-11-25 NOTE — Telephone Encounter (Signed)
I pended px for augmentin to go to pharmacy of choice, please send  Follow up if not starting to improve in a week

## 2021-11-25 NOTE — Telephone Encounter (Signed)
Pt called stating that he had a virtual visit with Cable on 11/22/21 for sinus pressure. Pt states that he is not feeling any better and would like for Dr Milinda Antis to call pt in an antibiotic called in.

## 2021-11-25 NOTE — Addendum Note (Signed)
Addended by: Shon Millet on: 11/25/2021 11:08 AM   Modules accepted: Orders

## 2021-11-25 NOTE — Telephone Encounter (Signed)
Dr Craige Cotta has read sleep study and I have scanned it to pt's chart.

## 2021-11-25 NOTE — Telephone Encounter (Signed)
Lm for patient.  

## 2021-11-25 NOTE — Telephone Encounter (Signed)
Pt notified of Dr. Tower's comments and Rx sent to pharmacy  

## 2021-11-25 NOTE — Addendum Note (Signed)
Addended by: Roxy Manns A on: 11/25/2021 09:23 AM   Modules accepted: Orders

## 2021-11-26 NOTE — Telephone Encounter (Signed)
Patient is aware of results and voiced his understanding.  Order placed for cpap. Recall placed. Nothing further needed at this time.

## 2021-11-27 ENCOUNTER — Other Ambulatory Visit: Payer: Self-pay | Admitting: Family Medicine

## 2021-12-04 ENCOUNTER — Other Ambulatory Visit: Payer: Self-pay

## 2021-12-04 ENCOUNTER — Ambulatory Visit (INDEPENDENT_AMBULATORY_CARE_PROVIDER_SITE_OTHER): Payer: BC Managed Care – PPO | Admitting: Family Medicine

## 2021-12-04 ENCOUNTER — Encounter: Payer: Self-pay | Admitting: Family Medicine

## 2021-12-04 VITALS — BP 128/84 | HR 64 | Temp 97.8°F | Ht 72.25 in | Wt 228.2 lb

## 2021-12-04 DIAGNOSIS — L989 Disorder of the skin and subcutaneous tissue, unspecified: Secondary | ICD-10-CM | POA: Insufficient documentation

## 2021-12-04 DIAGNOSIS — Z0000 Encounter for general adult medical examination without abnormal findings: Secondary | ICD-10-CM

## 2021-12-04 DIAGNOSIS — E781 Pure hyperglyceridemia: Secondary | ICD-10-CM

## 2021-12-04 DIAGNOSIS — E039 Hypothyroidism, unspecified: Secondary | ICD-10-CM

## 2021-12-04 DIAGNOSIS — Z6830 Body mass index (BMI) 30.0-30.9, adult: Secondary | ICD-10-CM

## 2021-12-04 DIAGNOSIS — E6609 Other obesity due to excess calories: Secondary | ICD-10-CM

## 2021-12-04 DIAGNOSIS — G4730 Sleep apnea, unspecified: Secondary | ICD-10-CM | POA: Insufficient documentation

## 2021-12-04 NOTE — Assessment & Plan Note (Signed)
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

## 2021-12-04 NOTE — Assessment & Plan Note (Signed)
Discussed how this problem influences overall health and the risks it imposes  Reviewed plan for weight loss with lower calorie diet (via better food choices and also portion control or program like weight watchers) and exercise building up to or more than 30 minutes 5 days per week including some aerobic activity    

## 2021-12-04 NOTE — Assessment & Plan Note (Signed)
No clinical changes  TSH ordered  Taking levothyroxine 112 mcg daily  Pending result to refill or change

## 2021-12-04 NOTE — Assessment & Plan Note (Signed)
Disc goals for lipids and reasons to control them Rev last labs with pt Rev low sat fat diet in detail Labs ordered Taking fenofibrate 54 mg daily -tolerates well  Reviewed diet /overall fair

## 2021-12-04 NOTE — Assessment & Plan Note (Signed)
Pt is waiting for cpap to get started with treatment

## 2021-12-04 NOTE — Assessment & Plan Note (Signed)
Reviewed health habits including diet and exercise and skin cancer prevention Reviewed appropriate screening tests for age  Also reviewed health mt list, fam hx and immunization status , as well as social and family history   See HPI Labs ordered Will start colon cancer screening at 23 No prostate symptoms  Declines flu shot  Planning to start cpap (will benefit his health)

## 2021-12-04 NOTE — Patient Instructions (Addendum)
I will place a referral to a dermatology office in [facility] for a skin exam   Use your sunscreen  Stay active  Try to get most of your carbohydrates from produce (with the exception of white potatoes)  Eat less bread/pasta/rice/snack foods/cereals/sweets and other items from the middle of the grocery store (processed carbs) Avoid red meat/ fried foods/ egg yolks/ fatty breakfast meats/ butter, cheese and high fat dairy/ and shellfish    Labs today   We will talk about colon cancer screening next year

## 2021-12-04 NOTE — Progress Notes (Signed)
Subjective:    Patient ID: Adam Weber, male    DOB: 08/24/1976, 45 y.o.   MRN: [id number]  This visit occurred during the SARS-CoV-2 public health emergency.  Safety protocols were in place, including screening questions prior to the visit, additional usage of staff PPE, and extensive cleaning of exam room while observing appropriate contact time as indicated for disinfecting solutions.   HPI Here for health maintenance exam and to review chronic medical problems    Wt Readings from Last 3 Encounters:  12/04/21 228 lb 4 oz (103.5 kg)  11/22/21 230 lb (104.3 kg)  09/18/21 235 lb 3.2 oz (106.7 kg)   30.74 kg/m  Doing well  A lot of respiratory illness at home  Taking care of  himself  Eating fairly healthy (avoid junk)  Trying to cut back on white food (sugar/flour) Much less sweet tea   Very active job and home life  Averages 10,000 steps per day   Prostate health  No urination changes  No nocturia as a rule   (occ once at 4 am)  No prostate cancer in the family   No colon cancer in family   Covid immunized  Flu shot declined Tdap 7/18   BP Readings from Last 3 Encounters:  12/04/21 128/84  09/18/21 132/80  07/29/21 128/70   Pulse Readings from Last 3 Encounters:  12/04/21 64  09/18/21 65  07/29/21 (!) 54    Recently dx with sleep apnea (mild)  Waiting on cpap    Hypothyroidism  Feels about the same (tired from sleep apnea)  No change in energy level/ hair or skin/ edema and no tremor Lab Results  Component Value Date   TSH 2.93 08/31/2020    Due for labs  Levothyroxine 112 mcg daily     Cholesterol Lab Results  Component Value Date   CHOL 174 08/31/2020   HDL 39 (L) 08/31/2020   LDLCALC 116 (H) 08/31/2020   LDLDIRECT 116.0 06/30/2018   TRIG 86 08/31/2020   CHOLHDL 4.5 08/31/2020   Fenofibrate 54 mg daily  Trying to eat less fatty foods    The 10-year ASCVD risk score (Arnett DK, et al., 2019) is: 1.9%   Values used to calculate  the score:     Age: 29 years     Sex: Male     Is Non-Hispanic African American: No     Diabetic: No     Tobacco smoker: No     Systolic Blood Pressure: 128 mmHg     Is BP treated: No     HDL Cholesterol: 39 mg/dL     Total Cholesterol: 174 mg/dL  No longer has chest tightness  Thinks it may have been muscular (some positions in bed make him sore in am-esp on R side)   Patient Active Problem List   Diagnosis Date Noted   Mild sleep apnea 12/04/2021   Skin lesion 12/04/2021   Snoring 07/29/2021   SOB (shortness of breath) on exertion 07/29/2021   Somnolence 07/29/2021   Routine general medical examination at a health care facility 07/31/2019   Obesity 11/25/2015   IBS (irritable bowel syndrome) 12/27/2014   Seasonal allergies 06/01/2013   Hypertriglyceridemia 05/17/2013   Hypothyroidism 05/17/2013   Past Medical History:  Diagnosis Date   History of chicken pox    History of hyperlipidemia    Thyroid disease    Past Surgical History:  Procedure Laterality Date   APPENDECTOMY  2011   HAND SURGERY  1997  Social History   Tobacco Use   Smoking status: Never   Smokeless tobacco: Never  Vaping Use   Vaping Use: Never used  Substance Use Topics   Alcohol use: Yes    Alcohol/week: 0.0 standard drinks    Comment: occ   Drug use: No   Family History  Problem Relation Age of Onset   Healthy Mother    Healthy Father    Hyperlipidemia Maternal Grandmother    Stroke Maternal Grandmother    Hypertension Maternal Grandmother    Diabetes Paternal Grandmother        well controlled with good diet   Cancer - Lung Paternal Grandfather    No Known Allergies Current Outpatient Medications on File Prior to Visit  Medication Sig Dispense Refill   famotidine (PEPCID) 20 MG tablet TAKE 1 TABLET BY MOUTH TWICE A DAY 180 tablet 1   fenofibrate 54 MG tablet TAKE 1 TABLET BY MOUTH EVERY DAY 90 tablet 1   levothyroxine (SYNTHROID) 112 MCG tablet Take 1 tablet (112 mcg total) by  mouth daily before breakfast. 90 tablet 2   levocetirizine (XYZAL) 5 MG tablet Take 1 tablet (5 mg total) by mouth every evening. (Patient not taking: Reported on 12/04/2021) 30 tablet 0   No current facility-administered medications on file prior to visit.    Review of Systems  Constitutional:  Positive for fatigue. Negative for activity change, appetite change, fever and unexpected weight change.  HENT:  Negative for congestion, rhinorrhea, sore throat and trouble swallowing.        Snoring/sleep apnea   Eyes:  Negative for pain, redness, itching and visual disturbance.  Respiratory:  Negative for cough, chest tightness, shortness of breath and wheezing.   Cardiovascular:  Negative for chest pain and palpitations.  Gastrointestinal:  Negative for abdominal pain, blood in stool, constipation, diarrhea and nausea.  Endocrine: Negative for cold intolerance, heat intolerance, polydipsia and polyuria.  Genitourinary:  Negative for difficulty urinating, dysuria, frequency and urgency.  Musculoskeletal:  Negative for arthralgias, joint swelling and myalgias.  Skin:  Negative for pallor and rash.  Neurological:  Negative for dizziness, tremors, weakness, numbness and headaches.  Hematological:  Negative for adenopathy. Does not bruise/bleed easily.  Psychiatric/Behavioral:  Negative for decreased concentration and dysphoric mood. The patient is not nervous/anxious.       Objective:   Physical Exam Constitutional:      General: He is not in acute distress.    Appearance: Normal appearance. He is well-developed. He is obese. He is not ill-appearing or diaphoretic.  HENT:     Head: Normocephalic and atraumatic.     Right Ear: Tympanic membrane, ear canal and external ear normal.     Left Ear: Tympanic membrane, ear canal and external ear normal.     Nose: Nose normal. No congestion.     Mouth/Throat:     Mouth: Mucous membranes are moist.     Pharynx: Oropharynx is clear. No posterior  oropharyngeal erythema.  Eyes:     General: No scleral icterus.       Right eye: No discharge.        Left eye: No discharge.     Conjunctiva/sclera: Conjunctivae normal.     Pupils: Pupils are equal, round, and reactive to light.  Neck:     Thyroid: No thyromegaly.     Vascular: No carotid bruit or JVD.  Cardiovascular:     Rate and Rhythm: Normal rate and regular rhythm.     Pulses: Normal  pulses.     Heart sounds: Normal heart sounds.    No gallop.  Pulmonary:     Effort: Pulmonary effort is normal. No respiratory distress.     Breath sounds: Normal breath sounds. No wheezing or rales.     Comments: Good air exch Chest:     Chest wall: No tenderness.  Abdominal:     General: Bowel sounds are normal. There is no distension or abdominal bruit.     Palpations: Abdomen is soft. There is no mass.     Tenderness: There is no abdominal tenderness.     Hernia: No hernia is present.  Musculoskeletal:        General: No tenderness.     Cervical back: Normal range of motion and neck supple. No rigidity. No muscular tenderness.     Right lower leg: No edema.     Left lower leg: No edema.  Lymphadenopathy:     Cervical: No cervical adenopathy.  Skin:    General: Skin is warm and dry.     Coloration: Skin is not pale.     Findings: No erythema or rash.     Comments: Solar lentigines diffusely  3-4 mm tan skin lesion on R shoulder with dimpling   Neurological:     Mental Status: He is alert.     Cranial Nerves: No cranial nerve deficit.     Motor: No abnormal muscle tone.     Coordination: Coordination normal.     Gait: Gait normal.     Deep Tendon Reflexes: Reflexes are normal and symmetric. Reflexes normal.  Psychiatric:        Mood and Affect: Mood normal.        Cognition and Memory: Cognition and memory normal.          Assessment & Plan:   Problem List Items Addressed This Visit       Respiratory   Mild sleep apnea    Pt is waiting for cpap to get started with  treatment        Endocrine   Hypothyroidism    No clinical changes  TSH ordered  Taking levothyroxine 112 mcg daily  Pending result to refill or change        Musculoskeletal and Integument   Skin lesion    3-4 mm on R shoulder/tan with dimpling Suspect dermatofibroma  Unsure if it has changed Will refer to derm for this and skin screening        Other   Hypertriglyceridemia    Disc goals for lipids and reasons to control them Rev last labs with pt Rev low sat fat diet in detail Labs ordered Taking fenofibrate 54 mg daily -tolerates well  Reviewed diet /overall fair       Obesity    Discussed how this problem influences overall health and the risks it imposes  Reviewed plan for weight loss with lower calorie diet (via better food choices and also portion control or program like weight watchers) and exercise building up to or more than 30 minutes 5 days per week including some aerobic activity         Routine general medical examination at a health care facility - Primary    Reviewed health habits including diet and exercise and skin cancer prevention Reviewed appropriate screening tests for age  Also reviewed health mt list, fam hx and immunization status , as well as social and family history   See HPI Labs ordered Will start colon  cancer screening at 46 No prostate symptoms  Declines flu shot  Planning to start cpap (will benefit his health)

## 2021-12-05 LAB — CBC WITH DIFFERENTIAL/PLATELET
Basophils Absolute: 0.1 10*3/uL (ref 0.0–0.1)
Basophils Relative: 1.1 % (ref 0.0–3.0)
Eosinophils Absolute: 0.2 10*3/uL (ref 0.0–0.7)
Eosinophils Relative: 1.8 % (ref 0.0–5.0)
HCT: 46 % (ref 39.0–52.0)
Hemoglobin: 15.7 g/dL (ref 13.0–17.0)
Lymphocytes Relative: 36.4 % (ref 12.0–46.0)
Lymphs Abs: 3.4 10*3/uL (ref 0.7–4.0)
MCHC: 34.2 g/dL (ref 30.0–36.0)
MCV: 86.1 fl (ref 78.0–100.0)
Monocytes Absolute: 0.6 10*3/uL (ref 0.1–1.0)
Monocytes Relative: 6.1 % (ref 3.0–12.0)
Neutro Abs: 5.1 10*3/uL (ref 1.4–7.7)
Neutrophils Relative %: 54.6 % (ref 43.0–77.0)
Platelets: 379 10*3/uL (ref 150.0–400.0)
RBC: 5.34 Mil/uL (ref 4.22–5.81)
RDW: 12.5 % (ref 11.5–15.5)
WBC: 9.4 10*3/uL (ref 4.0–10.5)

## 2021-12-05 LAB — COMPREHENSIVE METABOLIC PANEL
ALT: 28 U/L (ref 0–53)
AST: 22 U/L (ref 0–37)
Albumin: 4.5 g/dL (ref 3.5–5.2)
Alkaline Phosphatase: 61 U/L (ref 39–117)
BUN: 19 mg/dL (ref 6–23)
CO2: 30 mEq/L (ref 19–32)
Calcium: 10 mg/dL (ref 8.4–10.5)
Chloride: 104 mEq/L (ref 96–112)
Creatinine, Ser: 1.23 mg/dL (ref 0.40–1.50)
GFR: 71.6 mL/min (ref >60.00)
Glucose, Bld: 85 mg/dL (ref 70–99)
Potassium: 4.5 mEq/L (ref 3.5–5.1)
Sodium: 141 mEq/L (ref 135–145)
Total Bilirubin: 0.6 mg/dL (ref 0.2–1.2)
Total Protein: 7.1 g/dL (ref 6.0–8.3)

## 2021-12-05 LAB — LIPID PANEL
Cholesterol: 177 mg/dL (ref 0–200)
HDL: 33.1 mg/dL — ABNORMAL LOW (ref >39.00)
LDL Cholesterol: 104 mg/dL — ABNORMAL HIGH (ref 0–99)
NonHDL: 144.1
Total CHOL/HDL Ratio: 5
Triglycerides: 199 mg/dL — ABNORMAL HIGH (ref 0.0–149.0)
VLDL: 39.8 mg/dL (ref 0.0–40.0)

## 2021-12-05 LAB — TSH: TSH: 6.3 u[IU]/mL — ABNORMAL HIGH (ref 0.35–5.50)

## 2021-12-06 ENCOUNTER — Telehealth: Payer: Self-pay | Admitting: *Deleted

## 2021-12-06 MED ORDER — LEVOTHYROXINE SODIUM 125 MCG PO TABS: 125.0000 ug | ORAL_TABLET | Freq: Every day | ORAL | 3 refills | Status: DC

## 2021-12-06 NOTE — Telephone Encounter (Signed)
Pt notified of lab results and Dr. Tower's comments. Rx sent to pharmacy and f/u lab appt scheduled  

## 2021-12-06 NOTE — Telephone Encounter (Signed)
-----   Message from Judy Pimple, MD sent at 12/05/2021  3:53 PM EST ----- Triglycerides are up slightly  Watch fat in diet  TSH is up to 6.30  We need to increase levothyroxine from 112 to 125 mcg daily   = please send in #30 3 refills  Take this in am at least 30 min before food or vitamins or other medicines  Re check TSH in 4-6 wk please  Other labs stable

## 2022-01-19 ENCOUNTER — Telehealth: Payer: Self-pay | Admitting: Family Medicine

## 2022-01-19 DIAGNOSIS — E039 Hypothyroidism, unspecified: Secondary | ICD-10-CM

## 2022-01-19 NOTE — Telephone Encounter (Signed)
-----   Message from Ellamae Sia sent at 01/13/2022  3:06 PM EST ----- Regarding: Lab orders for Tuesday, 2.7.23 Lab orders, TSH?

## 2022-01-21 ENCOUNTER — Other Ambulatory Visit: Payer: BC Managed Care – PPO

## 2022-01-23 ENCOUNTER — Telehealth: Payer: Self-pay | Admitting: Family Medicine

## 2022-01-23 NOTE — Telephone Encounter (Signed)
-----   Message from Alvina Chou sent at 01/17/2022  2:07 PM EST ----- Regarding: Lab orders for Friday, 2.10.23 Lab orders for TSH?, thanks

## 2022-01-24 ENCOUNTER — Other Ambulatory Visit: Payer: BC Managed Care – PPO

## 2022-01-29 ENCOUNTER — Other Ambulatory Visit (INDEPENDENT_AMBULATORY_CARE_PROVIDER_SITE_OTHER): Payer: BC Managed Care – PPO

## 2022-01-29 ENCOUNTER — Other Ambulatory Visit: Payer: Self-pay

## 2022-01-29 DIAGNOSIS — E039 Hypothyroidism, unspecified: Secondary | ICD-10-CM | POA: Diagnosis not present

## 2022-01-29 LAB — TSH: TSH: 4.89 u[IU]/mL (ref 0.35–5.50)

## 2022-01-30 ENCOUNTER — Telehealth: Payer: Self-pay | Admitting: *Deleted

## 2022-01-30 MED ORDER — LEVOTHYROXINE SODIUM 125 MCG PO TABS: 125.0000 ug | ORAL_TABLET | Freq: Every day | ORAL | 2 refills | Status: DC

## 2022-01-30 NOTE — Telephone Encounter (Signed)
-----   Message from Judy Pimple, MD sent at 01/29/2022  8:42 PM EST ----- TSH is in the normal range  Continue current levothyroxine dose  Please refill for year if needed

## 2022-01-30 NOTE — Telephone Encounter (Signed)
Pt notified of Dr. Royden Purl comments regarding thyroid labs and new Rx sent to pharmacy

## 2022-02-06 ENCOUNTER — Ambulatory Visit: Payer: BC Managed Care – PPO | Admitting: Family Medicine

## 2022-02-06 ENCOUNTER — Other Ambulatory Visit: Payer: Self-pay

## 2022-02-06 ENCOUNTER — Ambulatory Visit (INDEPENDENT_AMBULATORY_CARE_PROVIDER_SITE_OTHER): Payer: BC Managed Care – PPO | Admitting: Internal Medicine

## 2022-02-06 ENCOUNTER — Encounter: Payer: Self-pay | Admitting: Internal Medicine

## 2022-02-06 DIAGNOSIS — M795 Residual foreign body in soft tissue: Secondary | ICD-10-CM | POA: Insufficient documentation

## 2022-02-06 DIAGNOSIS — Z1839 Other retained organic fragments: Secondary | ICD-10-CM

## 2022-02-06 NOTE — Assessment & Plan Note (Signed)
Apparent head of tick removed with sterile pickups/clamp Cleaned after Tolerated well  No sig inflammation---asked him to call for fever, rash, headache

## 2022-02-06 NOTE — Progress Notes (Signed)
° °  Subjective:    Patient ID: MAL GRISCOM, male    DOB: 1976/06/23, 46 y.o.   MRN: [id number]  HPI Here due to a tick bite  Went rabbit hunting yesterday Didn't notice anything till this morning Found tick on extensor left wrist Tried to get it out--but head broke off  Sensitive/sore--but not bad  Current Outpatient Medications on File Prior to Visit  Medication Sig Dispense Refill   famotidine (PEPCID) 20 MG tablet TAKE 1 TABLET BY MOUTH TWICE A DAY 180 tablet 1   fenofibrate 54 MG tablet TAKE 1 TABLET BY MOUTH EVERY DAY 90 tablet 1   levocetirizine (XYZAL) 5 MG tablet Take 1 tablet (5 mg total) by mouth every evening. 30 tablet 0   levothyroxine (SYNTHROID) 125 MCG tablet Take 1 tablet (125 mcg total) by mouth daily before breakfast. 90 tablet 2   No current facility-administered medications on file prior to visit.    No Known Allergies  Past Medical History:  Diagnosis Date   History of chicken pox    History of hyperlipidemia    Thyroid disease     Past Surgical History:  Procedure Laterality Date   APPENDECTOMY  2011   HAND SURGERY  1997    Family History  Problem Relation Age of Onset   Healthy Mother    Healthy Father    Hyperlipidemia Maternal Grandmother    Stroke Maternal Grandmother    Hypertension Maternal Grandmother    Diabetes Paternal Grandmother        well controlled with good diet   Cancer - Lung Paternal Grandfather     Social History   Socioeconomic History   Marital status: Married    Spouse name: Not on file   Number of children: Not on file   Years of education: Not on file   Highest education level: Not on file  Occupational History   Not on file  Tobacco Use   Smoking status: Never    Passive exposure: Past   Smokeless tobacco: Never  Vaping Use   Vaping Use: Never used  Substance and Sexual Activity   Alcohol use: Yes    Alcohol/week: 0.0 standard drinks    Comment: occ   Drug use: No   Sexual activity: Not on  file  Other Topics Concern   Not on file  Social History Narrative   Not on file   Social Determinants of Health   Financial Resource Strain: Not on file  Food Insecurity: Not on file  Transportation Needs: Not on file  Physical Activity: Not on file  Stress: Not on file  Social Connections: Not on file  Intimate Partner Violence: Not on file   Review of Systems No fever No headache     Objective:   Physical Exam Constitutional:      Appearance: Normal appearance.  Skin:    Comments: Small bite site with black in center  Neurological:     Mental Status: He is alert.           Assessment & Plan:

## 2022-02-08 ENCOUNTER — Other Ambulatory Visit: Payer: Self-pay | Admitting: Family Medicine

## 2022-04-04 ENCOUNTER — Telehealth: Payer: Self-pay | Admitting: Family Medicine

## 2022-04-04 DIAGNOSIS — J069 Acute upper respiratory infection, unspecified: Secondary | ICD-10-CM

## 2022-04-04 MED ORDER — LEVOCETIRIZINE DIHYDROCHLORIDE 5 MG PO TABS: 5.0000 mg | ORAL_TABLET | Freq: Every evening | ORAL | 1 refills | Status: DC

## 2022-04-04 NOTE — Telephone Encounter (Signed)
Encourage patient to contact the pharmacy for refills or they can request refills through [facility] ? ?Did the patient contact the pharmacy:  Yes, told to contact doctor for refill ? ? ?LAST APPOINTMENT DATE:  12/04/2021 ? ?NEXT APPOINTMENT DATE: N/A ? ?MEDICATION: levocetirizine (XYZAL) 5 MG tablet ? ?Is the patient out of medication? Yes ? ?Is this a 90 day supply: Yes ? ?PHARMACY: [id number] - [place], [place] - [id number] ? ?Let patient know to contact pharmacy at the end of the day to make sure medication is ready. ? ?Please notify patient to allow 48-72 hours to process ?  ?

## 2022-04-16 ENCOUNTER — Encounter: Payer: Self-pay | Admitting: Family Medicine

## 2022-04-16 ENCOUNTER — Ambulatory Visit: Payer: BC Managed Care – PPO | Admitting: Family Medicine

## 2022-04-16 VITALS — BP 131/80 | HR 60 | Temp 98.2°F | Ht 73.0 in | Wt 238.4 lb

## 2022-04-16 DIAGNOSIS — E039 Hypothyroidism, unspecified: Secondary | ICD-10-CM

## 2022-04-16 DIAGNOSIS — R55 Syncope and collapse: Secondary | ICD-10-CM | POA: Insufficient documentation

## 2022-04-16 NOTE — Assessment & Plan Note (Signed)
This occurred while working physically, became dizzy and had to sit on floor (not vertiginous) ?No assoc symptoms  ?Nl neuro exam  ?Suspect may be caused by dehydration during physical work (baseline low pulse) ?Lab today  ?EKG in nl range -reassuring  ?inst to inc fluids to 64 oz daily  ?Regular meals and breaks  ?If nl labs and return of symptoms consider further eval  ?

## 2022-04-16 NOTE — Progress Notes (Signed)
? ?Subjective:  ? ? Patient ID: Adam Weber, male    DOB: [birth year], 46 y.o.   MRN: [id number] ? ?HPI ?Pt presents with c/o light headedness ? ?Wt Readings from Last 3 Encounters:  ?04/16/22 238 lb 6 oz (108.1 kg)  ?02/06/22 240 lb (108.9 kg)  ?12/04/21 228 lb 4 oz (103.5 kg)  ? ?31.45 kg/m? ? ?Sunday had an episode mid day  ?Had been cleaning up (no fumes/did mop the floor)  ? ?He had lunch around 12:00 that day  ?Does not tend to drink enough fluids  ? ?Did have some a little alcohol the day before  ?Not binging /not hung over  ? ? ?This happened 92 y ago when he was dehydrated  ? ? ?Felt funny in head / surreal feeling  ?Not spinning but light headed  ?When to sit down and caught himself (like he could pass out) then sat on the floor   ?Out of it for a few seconds but no loss of consciousness  ? ?No head trauma  ? ?Bp has been a little high  ?140s/80s at home  ? ? ?132/70s today  ? ?No new medicines  ?A little nasal congestion /pollen  ? ? ?Using cpap so far/ going well so far  ? ? ?BP Readings from Last 3 Encounters:  ?04/16/22 (!) 146/82  ?02/06/22 112/84  ?12/04/21 128/84  ? ?Pulse Readings from Last 3 Encounters:  ?04/16/22 60  ?02/06/22 (!) 50  ?12/04/21 64  ? ?Grand mother had HTN and heart issues  ?Also uncle  ? ? ?EKG with no acute changes today ? ? ?Hypothyroidism  ?Pt has no clinical changes ?No change in energy level/ hair or skin/ edema and no tremor ?Lab Results  ?Component Value Date  ? TSH 4.89 01/29/2022  ?   ? ?Patient Active Problem List  ? Diagnosis Date Noted  ? Pre-syncope 04/16/2022  ? Retained tick parts of upper arm 02/06/2022  ? Mild sleep apnea 12/04/2021  ? Skin lesion 12/04/2021  ? Snoring 07/29/2021  ? SOB (shortness of breath) on exertion 07/29/2021  ? Somnolence 07/29/2021  ? Routine general medical examination at a health care facility 07/31/2019  ? Obesity 11/25/2015  ? IBS (irritable bowel syndrome) 12/27/2014  ? Seasonal allergies 06/01/2013  ? Hypertriglyceridemia  05/17/2013  ? Hypothyroidism 05/17/2013  ? ?Past Medical History:  ?Diagnosis Date  ? History of chicken pox   ? History of hyperlipidemia   ? Thyroid disease   ? ?Past Surgical History:  ?Procedure Laterality Date  ? APPENDECTOMY  2011  ? HAND SURGERY  1997  ? ?Social History  ? ?Tobacco Use  ? Smoking status: Never  ?  Passive exposure: Past  ? Smokeless tobacco: Never  ?Vaping Use  ? Vaping Use: Never used  ?Substance Use Topics  ? Alcohol use: Yes  ?  Alcohol/week: 0.0 standard drinks  ?  Comment: occ  ? Drug use: No  ? ?Family History  ?Problem Relation Age of Onset  ? Healthy Mother   ? Healthy Father   ? Hyperlipidemia Maternal Grandmother   ? Stroke Maternal Grandmother   ? Hypertension Maternal Grandmother   ? Diabetes Paternal Grandmother   ?     well controlled with good diet  ? Cancer - Lung Paternal Grandfather   ? ?No Known Allergies ?Current Outpatient Medications on File Prior to Visit  ?Medication Sig Dispense Refill  ? famotidine (PEPCID) 20 MG tablet TAKE 1 TABLET BY MOUTH TWICE A  DAY 180 tablet 1  ? fenofibrate 54 MG tablet TAKE 1 TABLET BY MOUTH EVERY DAY 90 tablet 1  ? levocetirizine (XYZAL) 5 MG tablet Take 1 tablet (5 mg total) by mouth every evening. 90 tablet 1  ? levothyroxine (SYNTHROID) 125 MCG tablet Take 1 tablet (125 mcg total) by mouth daily before breakfast. 90 tablet 2  ? ?No current facility-administered medications on file prior to visit.  ?  ?Review of Systems  ?Constitutional:  Negative for activity change, appetite change, fatigue, fever and unexpected weight change.  ?HENT:  Negative for congestion, rhinorrhea, sore throat and trouble swallowing.   ?Eyes:  Negative for pain, redness, itching and visual disturbance.  ?Respiratory:  Negative for cough, chest tightness, shortness of breath and wheezing.   ?Cardiovascular:  Negative for chest pain and palpitations.  ?Gastrointestinal:  Negative for abdominal pain, blood in stool, constipation, diarrhea and nausea.  ?Endocrine:  Negative for cold intolerance, heat intolerance, polydipsia and polyuria.  ?Genitourinary:  Negative for difficulty urinating, dysuria, frequency and urgency.  ?Musculoskeletal:  Negative for arthralgias, joint swelling and myalgias.  ?Skin:  Negative for pallor and rash.  ?Neurological:  Positive for light-headedness. Negative for dizziness, tremors, seizures, syncope, facial asymmetry, speech difficulty, weakness, numbness and headaches.  ?     Pre syncope ?No LOC  ?Hematological:  Negative for adenopathy. Does not bruise/bleed easily.  ?Psychiatric/Behavioral:  Negative for decreased concentration and dysphoric mood. The patient is not nervous/anxious.   ? ?   ?Objective:  ? Physical Exam ?Constitutional:   ?   General: He is not in acute distress. ?   Appearance: Normal appearance. He is well-developed. He is obese. He is not ill-appearing or diaphoretic.  ?HENT:  ?   Head: Normocephalic and atraumatic.  ?Eyes:  ?   Conjunctiva/sclera: Conjunctivae normal.  ?   Pupils: Pupils are equal, round, and reactive to light.  ?Neck:  ?   Thyroid: No thyromegaly.  ?   Vascular: No carotid bruit or JVD.  ?Cardiovascular:  ?   Rate and Rhythm: Normal rate and regular rhythm.  ?   Pulses: Normal pulses.  ?   Heart sounds: Normal heart sounds.  ?  No gallop.  ?   Comments: Pulse 50s-60 baseline  ?Pulmonary:  ?   Effort: Pulmonary effort is normal. No respiratory distress.  ?   Breath sounds: Normal breath sounds. No stridor. No wheezing, rhonchi or rales.  ?Abdominal:  ?   General: There is no distension or abdominal bruit.  ?   Palpations: Abdomen is soft. There is no mass.  ?   Tenderness: There is no abdominal tenderness.  ?   Hernia: No hernia is present.  ?Musculoskeletal:     ?   General: No tenderness.  ?   Cervical back: Normal range of motion and neck supple.  ?   Right lower leg: No edema.  ?   Left lower leg: No edema.  ?Lymphadenopathy:  ?   Cervical: No cervical adenopathy.  ?Skin: ?   General: Skin is warm and  dry.  ?   Coloration: Skin is not pale.  ?   Findings: No rash.  ?Neurological:  ?   Mental Status: He is alert.  ?   Cranial Nerves: No cranial nerve deficit.  ?   Sensory: No sensory deficit.  ?   Motor: No weakness.  ?   Coordination: Coordination normal.  ?   Gait: Gait normal.  ?   Deep Tendon Reflexes: Reflexes  are normal and symmetric. Reflexes normal.  ?Psychiatric:     ?   Mood and Affect: Mood normal.     ?   Cognition and Memory: Cognition and memory normal.  ?   Comments: Pleasant  ?Talkative   ? ? ? ? ? ?   ?Assessment & Plan:  ? ?Problem List Items Addressed This Visit   ? ?  ? Cardiovascular and Mediastinum  ? Pre-syncope - Primary  ?  This occurred while working physically, became dizzy and had to sit on floor (not vertiginous) ?No assoc symptoms  ?Nl neuro exam  ?Suspect may be caused by dehydration during physical work (baseline low pulse) ?Lab today  ?EKG in nl range -reassuring  ?inst to inc fluids to 64 oz daily  ?Regular meals and breaks  ?If nl labs and return of symptoms consider further eval  ? ?  ?  ? Relevant Orders  ? EKG 12-Lead (Completed)  ? CBC with Differential/Platelet  ? Comprehensive metabolic panel  ?  ? Endocrine  ? Hypothyroidism  ? Relevant Orders  ? TSH  ? ? ? ? ? ?

## 2022-04-16 NOTE — Patient Instructions (Addendum)
Aim for 64 oz of fluids daily /mostly water  ? ?If alcohol /drink same volume of water  ? ?Eat regularly (meals with produce and protein) ?Blood pressure is good  ? ?EKG today  ?Labs today ? ?If symptoms return or if you develop new ones let us know  ?  ? ? ?

## 2022-04-17 LAB — CBC WITH DIFFERENTIAL/PLATELET
Basophils Absolute: 0.1 10*3/uL (ref 0.0–0.1)
Basophils Relative: 1.2 % (ref 0.0–3.0)
Eosinophils Absolute: 0.1 10*3/uL (ref 0.0–0.7)
Eosinophils Relative: 1.8 % (ref 0.0–5.0)
HCT: 45.7 % (ref 39.0–52.0)
Hemoglobin: 15.7 g/dL (ref 13.0–17.0)
Lymphocytes Relative: 40.7 % (ref 12.0–46.0)
Lymphs Abs: 2.7 10*3/uL (ref 0.7–4.0)
MCHC: 34.2 g/dL (ref 30.0–36.0)
MCV: 86.4 fl (ref 78.0–100.0)
Monocytes Absolute: 0.5 10*3/uL (ref 0.1–1.0)
Monocytes Relative: 7.9 % (ref 3.0–12.0)
Neutro Abs: 3.2 10*3/uL (ref 1.4–7.7)
Neutrophils Relative %: 48.4 % (ref 43.0–77.0)
Platelets: 260 10*3/uL (ref 150.0–400.0)
RBC: 5.29 Mil/uL (ref 4.22–5.81)
RDW: 12.7 % (ref 11.5–15.5)
WBC: 6.6 10*3/uL (ref 4.0–10.5)

## 2022-04-17 LAB — COMPREHENSIVE METABOLIC PANEL
ALT: 19 U/L (ref 0–53)
AST: 17 U/L (ref 0–37)
Albumin: 4.7 g/dL (ref 3.5–5.2)
Alkaline Phosphatase: 52 U/L (ref 39–117)
BUN: 15 mg/dL (ref 6–23)
CO2: 28 mEq/L (ref 19–32)
Calcium: 9.5 mg/dL (ref 8.4–10.5)
Chloride: 104 mEq/L (ref 96–112)
Creatinine, Ser: 1.2 mg/dL (ref 0.40–1.50)
GFR: 73.56 mL/min (ref >60.00)
Glucose, Bld: 90 mg/dL (ref 70–99)
Potassium: 4.4 mEq/L (ref 3.5–5.1)
Sodium: 140 mEq/L (ref 135–145)
Total Bilirubin: 0.6 mg/dL (ref 0.2–1.2)
Total Protein: 6.8 g/dL (ref 6.0–8.3)

## 2022-04-17 LAB — TSH: TSH: 4.3 u[IU]/mL (ref 0.35–5.50)

## 2022-05-22 ENCOUNTER — Other Ambulatory Visit: Payer: Self-pay | Admitting: Family Medicine

## 2022-09-16 ENCOUNTER — Other Ambulatory Visit: Payer: Self-pay | Admitting: Family Medicine

## 2022-09-16 DIAGNOSIS — J069 Acute upper respiratory infection, unspecified: Secondary | ICD-10-CM

## 2022-09-30 ENCOUNTER — Ambulatory Visit: Payer: BC Managed Care – PPO | Admitting: Family Medicine

## 2022-09-30 ENCOUNTER — Encounter: Payer: Self-pay | Admitting: Family Medicine

## 2022-09-30 ENCOUNTER — Ambulatory Visit (INDEPENDENT_AMBULATORY_CARE_PROVIDER_SITE_OTHER)
Admission: RE | Admit: 2022-09-30 | Discharge: 2022-09-30 | Disposition: A | Discharge status: Home / Self Care | Payer: BC Managed Care – PPO | Source: Ambulatory Visit | Attending: Family Medicine | Admitting: Family Medicine

## 2022-09-30 DIAGNOSIS — M79674 Pain in right toe(s): Secondary | ICD-10-CM

## 2022-09-30 MED ORDER — INDOMETHACIN 50 MG PO CAPS: 50.0000 mg | ORAL_CAPSULE | Freq: Three times a day (TID) | ORAL | 0 refills | Status: AC | PRN

## 2022-09-30 NOTE — Progress Notes (Signed)
Subjective:    Patient ID: Adam Weber, male    DOB: 1976-03-05, 46 y.o.   MRN: [id number]  HPI Pt presents with swollen R 5th toe  Wt Readings from Last 3 Encounters:  09/30/22 240 lb 2 oz (108.9 kg)  04/16/22 238 lb 6 oz (108.1 kg)  02/06/22 240 lb (108.9 kg)   32.34 kg/m  Swollen R 5th toe  A few months ago he had occ shooting pain in that toe and lateral foot  (no trauma that he knows of)   Yesterday it got worse, very sore last night  Had to limp this am  As the day went on it improved (pain less but still red and swollen and warm)   Gout ?  Unsure if it was that sensitive   No ice or heat  Has not taken any otc medicines   Mom had out in the past   Lab Results  Component Value Date   CREATININE 1.20 04/16/2022   BUN 15 04/16/2022   NA 140 04/16/2022   K 4.4 04/16/2022   CL 104 04/16/2022   CO2 28 04/16/2022   Patient Active Problem List   Diagnosis Date Noted   Toe pain, right 09/30/2022   Pre-syncope 04/16/2022   Retained tick parts of upper arm 02/06/2022   Mild sleep apnea 12/04/2021   Skin lesion 12/04/2021   Snoring 07/29/2021   SOB (shortness of breath) on exertion 07/29/2021   Somnolence 07/29/2021   Routine general medical examination at a health care facility 07/31/2019   Obesity 11/25/2015   IBS (irritable bowel syndrome) 12/27/2014   Seasonal allergies 06/01/2013   Hypertriglyceridemia 05/17/2013   Hypothyroidism 05/17/2013   Past Medical History:  Diagnosis Date   History of chicken pox    History of hyperlipidemia    Thyroid disease    Past Surgical History:  Procedure Laterality Date   APPENDECTOMY  2011   HAND SURGERY  1997   Social History   Tobacco Use   Smoking status: Never    Passive exposure: Past   Smokeless tobacco: Never  Vaping Use   Vaping Use: Never used  Substance Use Topics   Alcohol use: Yes    Alcohol/week: 0.0 standard drinks of alcohol    Comment: occ   Drug use: No   Family History   Problem Relation Age of Onset   Healthy Mother    Healthy Father    Hyperlipidemia Maternal Grandmother    Stroke Maternal Grandmother    Hypertension Maternal Grandmother    Diabetes Paternal Grandmother        well controlled with good diet   Cancer - Lung Paternal Grandfather    No Known Allergies Current Outpatient Medications on File Prior to Visit  Medication Sig Dispense Refill   famotidine (PEPCID) 20 MG tablet TAKE 1 TABLET BY MOUTH TWICE A DAY 180 tablet 1   fenofibrate 54 MG tablet TAKE 1 TABLET BY MOUTH EVERY DAY 90 tablet 1   levocetirizine (XYZAL) 5 MG tablet TAKE 1 TABLET BY MOUTH EVERY DAY IN THE EVENING 90 tablet 1   levothyroxine (SYNTHROID) 125 MCG tablet Take 1 tablet (125 mcg total) by mouth daily before breakfast. 90 tablet 2   No current facility-administered medications on file prior to visit.     Review of Systems  Respiratory:  Negative for cough.   Cardiovascular:  Negative for chest pain, palpitations and leg swelling.  Musculoskeletal:  Positive for arthralgias.  Right foot/5th toe pain and swelling   Neurological:  Negative for dizziness, weakness and numbness.  Hematological:  Negative for adenopathy. Does not bruise/bleed easily.       Objective:   Physical Exam Constitutional:      General: He is not in acute distress.    Appearance: Normal appearance. He is obese. He is not ill-appearing or diaphoretic.  Eyes:     General: No scleral icterus.    Conjunctiva/sclera: Conjunctivae normal.     Pupils: Pupils are equal, round, and reactive to light.  Cardiovascular:     Rate and Rhythm: Regular rhythm. Bradycardia present.  Pulmonary:     Effort: Pulmonary effort is normal. No respiratory distress.     Breath sounds: Normal breath sounds.  Musculoskeletal:     Cervical back: Neck supple.     Comments: L 5th MTP joint is swollen, erythematous and warm  Some mild tenderness Nl rom Nl perfusion  No skin interruption   No other  joint changes   Skin:    Findings: Erythema present.  Neurological:     Mental Status: He is alert.     Sensory: No sensory deficit.     Motor: No weakness.     Coordination: Coordination normal.     Deep Tendon Reflexes: Reflexes normal.  Psychiatric:        Mood and Affect: Mood normal.           Assessment & Plan:   Problem List Items Addressed This Visit       Other   Toe pain, right    Pain, swelling and erythema of 5th MTP joint  Suspect gout  Reviewed diet, enc more water intake  Xray today  Lab today incl cmp, cbc and uric acid  Indocin 50 mg to take with food prn with elevation and protection Handout given  Further plan to follow results       Relevant Orders   DG Foot Complete Right   Comprehensive metabolic panel   Uric acid   CBC with Differential/Platelet

## 2022-09-30 NOTE — Patient Instructions (Addendum)
Xray of foot now  We will have a reading by the morning  Elevate foot when you can   Labs for chem and uric acid also   Try the indocin with food for pain   If symptoms worsen let us know

## 2022-09-30 NOTE — Assessment & Plan Note (Signed)
Pain, swelling and erythema of 5th MTP joint  Suspect gout  Reviewed diet, enc more water intake  Xray today  Lab today incl cmp, cbc and uric acid  Indocin 50 mg to take with food prn with elevation and protection Handout given  Further plan to follow results

## 2022-10-01 LAB — COMPREHENSIVE METABOLIC PANEL
ALT: 21 U/L (ref 0–53)
AST: 18 U/L (ref 0–37)
Albumin: 4.8 g/dL (ref 3.5–5.2)
Alkaline Phosphatase: 52 U/L (ref 39–117)
BUN: 18 mg/dL (ref 6–23)
CO2: 26 mEq/L (ref 19–32)
Calcium: 9.8 mg/dL (ref 8.4–10.5)
Chloride: 104 mEq/L (ref 96–112)
Creatinine, Ser: 1.08 mg/dL (ref 0.40–1.50)
GFR: 83.21 mL/min (ref >60.00)
Glucose, Bld: 82 mg/dL (ref 70–99)
Potassium: 3.8 mEq/L (ref 3.5–5.1)
Sodium: 138 mEq/L (ref 135–145)
Total Bilirubin: 0.7 mg/dL (ref 0.2–1.2)
Total Protein: 7 g/dL (ref 6.0–8.3)

## 2022-10-01 LAB — CBC WITH DIFFERENTIAL/PLATELET
Basophils Absolute: 0.1 10*3/uL (ref 0.0–0.1)
Basophils Relative: 0.9 % (ref 0.0–3.0)
Eosinophils Absolute: 0.2 10*3/uL (ref 0.0–0.7)
Eosinophils Relative: 2.4 % (ref 0.0–5.0)
HCT: 43.9 % (ref 39.0–52.0)
Hemoglobin: 15.6 g/dL (ref 13.0–17.0)
Lymphocytes Relative: 36.9 % (ref 12.0–46.0)
Lymphs Abs: 2.9 10*3/uL (ref 0.7–4.0)
MCHC: 35.5 g/dL (ref 30.0–36.0)
MCV: 85.5 fl (ref 78.0–100.0)
Monocytes Absolute: 0.6 10*3/uL (ref 0.1–1.0)
Monocytes Relative: 7 % (ref 3.0–12.0)
Neutro Abs: 4.2 10*3/uL (ref 1.4–7.7)
Neutrophils Relative %: 52.8 % (ref 43.0–77.0)
Platelets: 256 10*3/uL (ref 150.0–400.0)
RBC: 5.13 Mil/uL (ref 4.22–5.81)
RDW: 12.4 % (ref 11.5–15.5)
WBC: 7.9 10*3/uL (ref 4.0–10.5)

## 2022-10-01 LAB — URIC ACID: Uric Acid, Serum: 5.5 mg/dL (ref 4.0–7.8)

## 2022-12-01 ENCOUNTER — Telehealth: Payer: Self-pay | Admitting: Family Medicine

## 2022-12-01 DIAGNOSIS — E039 Hypothyroidism, unspecified: Secondary | ICD-10-CM

## 2022-12-01 DIAGNOSIS — E781 Pure hyperglyceridemia: Secondary | ICD-10-CM

## 2022-12-01 DIAGNOSIS — Z0000 Encounter for general adult medical examination without abnormal findings: Secondary | ICD-10-CM

## 2022-12-01 NOTE — Telephone Encounter (Signed)
-----   Message from Ronalee Red, RT sent at 11/17/2022  9:16 AM EST ----- Regarding: Tue 12/19 lab Patient is scheduled for cpx, please order future labs.  Thanks, Jae Dire

## 2022-12-02 ENCOUNTER — Other Ambulatory Visit (INDEPENDENT_AMBULATORY_CARE_PROVIDER_SITE_OTHER): Payer: BC Managed Care – PPO

## 2022-12-02 DIAGNOSIS — Z0000 Encounter for general adult medical examination without abnormal findings: Secondary | ICD-10-CM | POA: Diagnosis not present

## 2022-12-02 DIAGNOSIS — E039 Hypothyroidism, unspecified: Secondary | ICD-10-CM | POA: Diagnosis not present

## 2022-12-02 DIAGNOSIS — E781 Pure hyperglyceridemia: Secondary | ICD-10-CM | POA: Diagnosis not present

## 2022-12-02 LAB — CBC WITH DIFFERENTIAL/PLATELET
Basophils Absolute: 0 10*3/uL (ref 0.0–0.1)
Basophils Relative: 0.4 % (ref 0.0–3.0)
Eosinophils Absolute: 0.2 10*3/uL (ref 0.0–0.7)
Eosinophils Relative: 4 % (ref 0.0–5.0)
HCT: 47.7 % (ref 39.0–52.0)
Hemoglobin: 16.8 g/dL (ref 13.0–17.0)
Lymphocytes Relative: 42.6 % (ref 12.0–46.0)
Lymphs Abs: 2.4 10*3/uL (ref 0.7–4.0)
MCHC: 35.3 g/dL (ref 30.0–36.0)
MCV: 85.5 fl (ref 78.0–100.0)
Monocytes Absolute: 0.7 10*3/uL (ref 0.1–1.0)
Monocytes Relative: 11.6 % (ref 3.0–12.0)
Neutro Abs: 2.3 10*3/uL (ref 1.4–7.7)
Neutrophils Relative %: 41.4 % — ABNORMAL LOW (ref 43.0–77.0)
Platelets: 247 10*3/uL (ref 150.0–400.0)
RBC: 5.58 Mil/uL (ref 4.22–5.81)
RDW: 12.5 % (ref 11.5–15.5)
WBC: 5.6 10*3/uL (ref 4.0–10.5)

## 2022-12-02 LAB — COMPREHENSIVE METABOLIC PANEL
ALT: 22 U/L (ref 0–53)
AST: 19 U/L (ref 0–37)
Albumin: 4.5 g/dL (ref 3.5–5.2)
Alkaline Phosphatase: 57 U/L (ref 39–117)
BUN: 18 mg/dL (ref 6–23)
CO2: 29 mEq/L (ref 19–32)
Calcium: 9.3 mg/dL (ref 8.4–10.5)
Chloride: 104 mEq/L (ref 96–112)
Creatinine, Ser: 1.18 mg/dL (ref 0.40–1.50)
GFR: 74.73 mL/min (ref >60.00)
Glucose, Bld: 89 mg/dL (ref 70–99)
Potassium: 4.2 mEq/L (ref 3.5–5.1)
Sodium: 141 mEq/L (ref 135–145)
Total Bilirubin: 0.5 mg/dL (ref 0.2–1.2)
Total Protein: 6.7 g/dL (ref 6.0–8.3)

## 2022-12-02 LAB — LIPID PANEL
Cholesterol: 191 mg/dL (ref 0–200)
HDL: 36.5 mg/dL — ABNORMAL LOW (ref >39.00)
LDL Cholesterol: 126 mg/dL — ABNORMAL HIGH (ref 0–99)
NonHDL: 154.61
Total CHOL/HDL Ratio: 5
Triglycerides: 144 mg/dL (ref 0.0–149.0)
VLDL: 28.8 mg/dL (ref 0.0–40.0)

## 2022-12-02 LAB — TSH: TSH: 5.13 u[IU]/mL (ref 0.35–5.50)

## 2022-12-09 ENCOUNTER — Encounter: Payer: Self-pay | Admitting: Family Medicine

## 2022-12-09 ENCOUNTER — Ambulatory Visit (INDEPENDENT_AMBULATORY_CARE_PROVIDER_SITE_OTHER): Payer: BC Managed Care – PPO | Admitting: Family Medicine

## 2022-12-09 VITALS — BP 118/76 | HR 58 | Temp 97.5°F | Ht 72.25 in | Wt 240.2 lb

## 2022-12-09 DIAGNOSIS — E039 Hypothyroidism, unspecified: Secondary | ICD-10-CM

## 2022-12-09 DIAGNOSIS — G4730 Sleep apnea, unspecified: Secondary | ICD-10-CM

## 2022-12-09 DIAGNOSIS — E6609 Other obesity due to excess calories: Secondary | ICD-10-CM

## 2022-12-09 DIAGNOSIS — Z1211 Encounter for screening for malignant neoplasm of colon: Secondary | ICD-10-CM | POA: Diagnosis not present

## 2022-12-09 DIAGNOSIS — Z6830 Body mass index (BMI) 30.0-30.9, adult: Secondary | ICD-10-CM

## 2022-12-09 DIAGNOSIS — E781 Pure hyperglyceridemia: Secondary | ICD-10-CM

## 2022-12-09 DIAGNOSIS — Z0000 Encounter for general adult medical examination without abnormal findings: Secondary | ICD-10-CM | POA: Diagnosis not present

## 2022-12-09 MED ORDER — FAMOTIDINE 20 MG PO TABS: 20.0000 mg | ORAL_TABLET | Freq: Two times a day (BID) | ORAL | 3 refills | Status: DC

## 2022-12-09 MED ORDER — FENOFIBRATE 54 MG PO TABS: 54.0000 mg | ORAL_TABLET | Freq: Every day | ORAL | 3 refills | Status: DC

## 2022-12-09 MED ORDER — LEVOTHYROXINE SODIUM 125 MCG PO TABS: 125.0000 ug | ORAL_TABLET | Freq: Every day | ORAL | 2 refills | Status: DC

## 2022-12-09 NOTE — Patient Instructions (Addendum)
I put a GI referral in for a colonoscopy  Call the following office to schedule for your spring break   [facility]  [phone]   For cholesterol Avoid red meat/ fried foods/ egg yolks/ fatty breakfast meats/ butter, cheese and high fat dairy/ and shellfish   Eat a balanced diet   Think about adding some strength training   Use sun protection

## 2022-12-09 NOTE — Assessment & Plan Note (Signed)
Discussed how this problem influences overall health and the risks it imposes  Reviewed plan for weight loss with lower calorie diet (via better food choices and also portion control or program like weight watchers) and exercise building up to or more than 30 minutes 5 days per week including some aerobic activity  Pt has muscular frame that makes bmi higher

## 2022-12-09 NOTE — Assessment & Plan Note (Signed)
Doing well with cpap.

## 2022-12-09 NOTE — Assessment & Plan Note (Signed)
Disc goals for lipids and reasons to control them Rev last labs with pt Rev low sat fat diet in detail  LDL if 126-this needs to be watched Trig of 144- good - will continue fenofibrate 54 mg daily

## 2022-12-09 NOTE — Assessment & Plan Note (Signed)
Reviewed health habits including diet and exercise and skin cancer prevention Reviewed appropriate screening tests for age  Also reviewed health mt list, fam hx and immunization status , as well as social and family history   See HPI Labs reviewed Ref for colonoscopy done- pt will call to schedule  Utd derm care No clinical prostate changes Declines flu shot  Using cpap Enc more exercise

## 2022-12-09 NOTE — Assessment & Plan Note (Signed)
Hypothyroidism  Pt has no clinical changes No change in energy level/ hair or skin/ edema and no tremor Lab Results  Component Value Date   TSH 5.13 12/02/2022    Doing well with levothy 125 mcg daily

## 2022-12-09 NOTE — Assessment & Plan Note (Signed)
Referral done to GI to plan a screening colonoscopy

## 2022-12-09 NOTE — Progress Notes (Signed)
Subjective:    Patient ID: Adam Weber, male    DOB: 08/31/1976, 46 y.o.   MRN: [id number]  HPI Here for health maintenance exam and to review chronic medical problems    Wt Readings from Last 3 Encounters:  12/09/22 240 lb 4 oz (109 kg)  09/30/22 240 lb 2 oz (108.9 kg)  04/16/22 238 lb 6 oz (108.1 kg)   32.36 kg/m  Overall feeling fine  Some aches and pains   Taking care of himself  Exercise is not consistent  Very active   Gave up sweet tea and soft drinks  Few diet drinks  More water  Trying to eat healthy, could be better   Is a PE teacher   Immunization History  Administered Date(s) Administered   PFIZER(Purple Top)SARS-COV-2 Vaccination 03/09/2020, 04/04/2020   Tdap 06/24/2017   Health Maintenance Due  Topic Date Due   COVID-19 Vaccine (3 - 2023-24 season) 08/15/2022   COLONOSCOPY (Pts 45-484yrs Insurance coverage will need to be confirmed)  Never done   Colon cancer screening :  No fam h/o colon cancer  Would like to do a colonoscopy   Declines flu shot   Prostate health No fam h/o cancer  No change in urination  No nocturia   Has cpap now for sleep apnea - working well He feels better   BP Readings from Last 3 Encounters:  12/09/22 118/76  09/30/22 (!) 142/80  04/16/22 131/80   Pulse Readings from Last 3 Encounters:  12/09/22 (!) 58  09/30/22 (!) 55  04/16/22 60     Hyperlipidemia/ triglycerides Lab Results  Component Value Date   CHOL 191 12/02/2022   CHOL 177 12/04/2021   CHOL 174 08/31/2020   Lab Results  Component Value Date   HDL 36.50 (L) 12/02/2022   HDL 33.10 (L) 12/04/2021   HDL 39 (L) 08/31/2020   Lab Results  Component Value Date   LDLCALC 126 (H) 12/02/2022   LDLCALC 104 (H) 12/04/2021   LDLCALC 116 (H) 08/31/2020   Lab Results  Component Value Date   TRIG 144.0 12/02/2022   TRIG 199.0 (H) 12/04/2021   TRIG 86 08/31/2020   Lab Results  Component Value Date   CHOLHDL 5 12/02/2022   CHOLHDL 5  12/04/2021   CHOLHDL 4.5 08/31/2020   Lab Results  Component Value Date   LDLDIRECT 116.0 06/30/2018   LDLDIRECT 86.0 09/15/2016   LDLDIRECT 109.0 04/15/2016   Well controlled trig with fenofibrate 54 mg  Avoids fried foods  Some red meat but more chicken and lean meat/ some deer meat  Occ sausage and bacon   Lab Results  Component Value Date   LABURIC 5.5 09/30/2022    Other labs Lab Results  Component Value Date   CREATININE 1.18 12/02/2022   BUN 18 12/02/2022   NA 141 12/02/2022   K 4.2 12/02/2022   CL 104 12/02/2022   CO2 29 12/02/2022  Glucose of 89 Lab Results  Component Value Date   ALT 22 12/02/2022   AST 19 12/02/2022   ALKPHOS 57 12/02/2022   BILITOT 0.5 12/02/2022   Lab Results  Component Value Date   WBC 5.6 12/02/2022   HGB 16.8 12/02/2022   HCT 47.7 12/02/2022   MCV 85.5 12/02/2022   PLT 247.0 12/02/2022   Lab Results  Component Value Date   TSH 5.13 12/02/2022    Patient Active Problem List   Diagnosis Date Noted   Colon cancer screening 12/09/2022   Pre-syncope  04/16/2022   Mild sleep apnea 12/04/2021   Skin lesion 12/04/2021   Routine general medical examination at a health care facility 07/31/2019   Obesity 11/25/2015   IBS (irritable bowel syndrome) 12/27/2014   Seasonal allergies 06/01/2013   Hypertriglyceridemia 05/17/2013   Hypothyroidism 05/17/2013   Past Medical History:  Diagnosis Date   History of chicken pox    History of hyperlipidemia    Thyroid disease    Past Surgical History:  Procedure Laterality Date   APPENDECTOMY  2011   HAND SURGERY  1997   Social History   Tobacco Use   Smoking status: Never    Passive exposure: Past   Smokeless tobacco: Never  Vaping Use   Vaping Use: Never used  Substance Use Topics   Alcohol use: Yes    Alcohol/week: 0.0 standard drinks of alcohol    Comment: occ   Drug use: No   Family History  Problem Relation Age of Onset   Healthy Mother    Healthy Father     Hyperlipidemia Maternal Grandmother    Stroke Maternal Grandmother    Hypertension Maternal Grandmother    Diabetes Paternal Grandmother        well controlled with good diet   Cancer - Lung Paternal Grandfather    No Known Allergies Current Outpatient Medications on File Prior to Visit  Medication Sig Dispense Refill   indomethacin (INDOCIN) 50 MG capsule Take 1 capsule (50 mg total) by mouth 3 (three) times daily as needed for moderate pain (toe pain). With a meal 20 capsule 0   levocetirizine (XYZAL) 5 MG tablet TAKE 1 TABLET BY MOUTH EVERY DAY IN THE EVENING 90 tablet 1   No current facility-administered medications on file prior to visit.    Review of Systems  Constitutional:  Negative for activity change, appetite change, fatigue, fever and unexpected weight change.  HENT:  Negative for congestion, rhinorrhea, sore throat and trouble swallowing.   Eyes:  Negative for pain, redness, itching and visual disturbance.  Respiratory:  Negative for cough, chest tightness, shortness of breath and wheezing.   Cardiovascular:  Negative for chest pain and palpitations.  Gastrointestinal:  Negative for abdominal pain, blood in stool, constipation, diarrhea and nausea.  Endocrine: Negative for cold intolerance, heat intolerance, polydipsia and polyuria.  Genitourinary:  Negative for difficulty urinating, dysuria, frequency and urgency.  Musculoskeletal:  Positive for arthralgias. Negative for joint swelling and myalgias.  Skin:  Negative for pallor and rash.  Neurological:  Negative for dizziness, tremors, weakness, numbness and headaches.  Hematological:  Negative for adenopathy. Does not bruise/bleed easily.  Psychiatric/Behavioral:  Negative for decreased concentration and dysphoric mood. The patient is not nervous/anxious.        Objective:   Physical Exam Constitutional:      General: He is not in acute distress.    Appearance: Normal appearance. He is well-developed. He is obese. He  is not ill-appearing or diaphoretic.  HENT:     Head: Normocephalic and atraumatic.     Right Ear: Tympanic membrane, ear canal and external ear normal.     Left Ear: Tympanic membrane, ear canal and external ear normal.     Nose: Nose normal. No congestion.     Mouth/Throat:     Mouth: Mucous membranes are moist.     Pharynx: Oropharynx is clear. No posterior oropharyngeal erythema.  Eyes:     General: No scleral icterus.       Right eye: No discharge.  Left eye: No discharge.     Conjunctiva/sclera: Conjunctivae normal.     Pupils: Pupils are equal, round, and reactive to light.  Neck:     Thyroid: No thyromegaly.     Vascular: No carotid bruit or JVD.  Cardiovascular:     Rate and Rhythm: Normal rate and regular rhythm.     Pulses: Normal pulses.     Heart sounds: Normal heart sounds.     No gallop.  Pulmonary:     Effort: Pulmonary effort is normal. No respiratory distress.     Breath sounds: Normal breath sounds. No wheezing or rales.     Comments: Good air exch Chest:     Chest wall: No tenderness.  Abdominal:     General: Bowel sounds are normal. There is no distension or abdominal bruit.     Palpations: Abdomen is soft. There is no mass.     Tenderness: There is no abdominal tenderness.     Hernia: No hernia is present.  Musculoskeletal:        General: No tenderness.     Cervical back: Normal range of motion and neck supple. No rigidity. No muscular tenderness.     Right lower leg: No edema.     Left lower leg: No edema.  Lymphadenopathy:     Cervical: No cervical adenopathy.  Skin:    General: Skin is warm and dry.     Coloration: Skin is not pale.     Findings: No erythema or rash.     Comments: Solar lentigines diffusely   Neurological:     Mental Status: He is alert.     Cranial Nerves: No cranial nerve deficit.     Motor: No abnormal muscle tone.     Coordination: Coordination normal.     Gait: Gait normal.     Deep Tendon Reflexes: Reflexes  are normal and symmetric. Reflexes normal.  Psychiatric:        Mood and Affect: Mood normal.        Cognition and Memory: Cognition normal.           Assessment & Plan:  . Problem List Items Addressed This Visit       Respiratory   Mild sleep apnea    Doing well with cpap        Endocrine   Hypothyroidism    Hypothyroidism  Pt has no clinical changes No change in energy level/ hair or skin/ edema and no tremor Lab Results  Component Value Date   TSH 5.13 12/02/2022    Doing well with levothy 125 mcg daily      Relevant Medications   levothyroxine (SYNTHROID) 125 MCG tablet     Other   Colon cancer screening    Referral done to GI to plan a screening colonoscopy       Relevant Orders   Ambulatory referral to Gastroenterology   Hypertriglyceridemia    Disc goals for lipids and reasons to control them Rev last labs with pt Rev low sat fat diet in detail  LDL if 126-this needs to be watched Trig of 144- good - will continue fenofibrate 54 mg daily       Relevant Medications   fenofibrate 54 MG tablet   Obesity    Discussed how this problem influences overall health and the risks it imposes  Reviewed plan for weight loss with lower calorie diet (via better food choices and also portion control or program like weight watchers) and exercise  building up to or more than 30 minutes 5 days per week including some aerobic activity  Pt has muscular frame that makes bmi higher       Routine general medical examination at a health care facility - Primary    Reviewed health habits including diet and exercise and skin cancer prevention Reviewed appropriate screening tests for age  Also reviewed health mt list, fam hx and immunization status , as well as social and family history   See HPI Labs reviewed Ref for colonoscopy done- pt will call to schedule  Utd derm care No clinical prostate changes Declines flu shot  Using cpap Enc more exercise

## 2022-12-19 ENCOUNTER — Telehealth: Payer: Self-pay

## 2022-12-19 ENCOUNTER — Other Ambulatory Visit: Payer: Self-pay

## 2022-12-19 DIAGNOSIS — Z1211 Encounter for screening for malignant neoplasm of colon: Secondary | ICD-10-CM

## 2022-12-19 MED ORDER — NA SULFATE-K SULFATE-MG SULF 17.5-3.13-1.6 GM/177ML PO SOLN: 1.0000 | Freq: Once | ORAL | 0 refills | Status: AC

## 2022-12-19 NOTE — Telephone Encounter (Signed)
Gastroenterology Pre-Procedure Review  Request Date: 03/09/23 Requesting Physician: Dr. Marius Ditch  PATIENT REVIEW QUESTIONS: The patient responded to the following health history questions as indicated:    1. Are you having any GI issues? no 2. Do you have a personal history of Polyps? no 3. Do you have a family history of Colon Cancer or Polyps? no 4. Diabetes Mellitus? no 5. Joint replacements in the past 12 months?no 6. Major health problems in the past 3 months?no 7. Any artificial heart valves, MVP, or defibrillator?no    MEDICATIONS & ALLERGIES:    Patient reports the following regarding taking any anticoagulation/antiplatelet therapy:   Plavix, Coumadin, Eliquis, Xarelto, Lovenox, Pradaxa, Brilinta, or Effient? no Aspirin? no  Patient confirms/reports the following medications:  Current Outpatient Medications  Medication Sig Dispense Refill   famotidine (PEPCID) 20 MG tablet Take 1 tablet (20 mg total) by mouth 2 (two) times daily. 180 tablet 3   fenofibrate 54 MG tablet Take 1 tablet (54 mg total) by mouth daily. 90 tablet 3   indomethacin (INDOCIN) 50 MG capsule Take 1 capsule (50 mg total) by mouth 3 (three) times daily as needed for moderate pain (toe pain). With a meal 20 capsule 0   levocetirizine (XYZAL) 5 MG tablet TAKE 1 TABLET BY MOUTH EVERY DAY IN THE EVENING 90 tablet 1   levothyroxine (SYNTHROID) 125 MCG tablet Take 1 tablet (125 mcg total) by mouth daily before breakfast. 90 tablet 2   No current facility-administered medications for this visit.    Patient confirms/reports the following allergies:  No Known Allergies  No orders of the defined types were placed in this encounter.   AUTHORIZATION INFORMATION Primary Insurance: 1D#: Group #:  Secondary Insurance: 1D#: Group #:  SCHEDULE INFORMATION: Date: 03/09/23 Time: Location: [facility]

## 2023-03-06 ENCOUNTER — Encounter: Payer: Self-pay | Admitting: Gastroenterology

## 2023-03-09 ENCOUNTER — Encounter
Admission: RE | Disposition: A | Discharge status: Home / Self Care | Payer: Self-pay | Source: Ambulatory Visit | Attending: Gastroenterology

## 2023-03-09 ENCOUNTER — Other Ambulatory Visit: Payer: Self-pay

## 2023-03-09 ENCOUNTER — Ambulatory Visit
Admission: RE | Admit: 2023-03-09 | Discharge: 2023-03-09 | Disposition: A | Discharge status: Home / Self Care | Payer: BC Managed Care – PPO | Source: Ambulatory Visit | Attending: Gastroenterology | Admitting: Gastroenterology

## 2023-03-09 ENCOUNTER — Ambulatory Visit: Payer: BC Managed Care – PPO | Admitting: Anesthesiology

## 2023-03-09 ENCOUNTER — Encounter: Payer: Self-pay | Admitting: Gastroenterology

## 2023-03-09 DIAGNOSIS — Z1211 Encounter for screening for malignant neoplasm of colon: Secondary | ICD-10-CM | POA: Diagnosis not present

## 2023-03-09 DIAGNOSIS — E039 Hypothyroidism, unspecified: Secondary | ICD-10-CM | POA: Insufficient documentation

## 2023-03-09 DIAGNOSIS — K6289 Other specified diseases of anus and rectum: Secondary | ICD-10-CM | POA: Diagnosis not present

## 2023-03-09 HISTORY — DX: Sleep apnea, unspecified: G47.30

## 2023-03-09 HISTORY — PX: COLONOSCOPY WITH PROPOFOL: SHX5780

## 2023-03-09 SURGERY — COLONOSCOPY WITH PROPOFOL
Anesthesia: General

## 2023-03-09 MED ORDER — SODIUM CHLORIDE 0.9 % IV SOLN: INTRAVENOUS | Status: DC

## 2023-03-09 MED ORDER — GLYCOPYRROLATE 0.2 MG/ML IJ SOLN
INTRAMUSCULAR | Status: DC | PRN
  Administered 2023-03-09: .2 mg via INTRAVENOUS

## 2023-03-09 MED ORDER — LIDOCAINE HCL (CARDIAC) PF 100 MG/5ML IV SOSY
PREFILLED_SYRINGE | INTRAVENOUS | Status: DC | PRN
  Administered 2023-03-09: 50 mg via INTRAVENOUS

## 2023-03-09 MED ORDER — DEXMEDETOMIDINE HCL IN NACL 80 MCG/20ML IV SOLN
INTRAVENOUS | Status: DC | PRN
  Administered 2023-03-09: 8 ug via BUCCAL

## 2023-03-09 MED ORDER — PROPOFOL 500 MG/50ML IV EMUL
INTRAVENOUS | Status: DC | PRN
  Administered 2023-03-09: 150 ug/kg/min via INTRAVENOUS

## 2023-03-09 MED ORDER — PROPOFOL 10 MG/ML IV BOLUS
INTRAVENOUS | Status: DC | PRN
  Administered 2023-03-09: 60 mg via INTRAVENOUS

## 2023-03-09 MED ORDER — PROPOFOL 1000 MG/100ML IV EMUL
INTRAVENOUS | Status: AC
  Filled 2023-03-09: qty 100

## 2023-03-09 NOTE — H&P (Signed)
Cephas Darby, MD [address]  [place]  [place], [place] [address]  Main: [phone]  Fax: [phone] Pager: [phone]  Primary Care Physician:  Tower, Wynelle Fanny, MD Primary Gastroenterologist:  Dr. Cephas Darby  Pre-Procedure History & Physical: HPI:  Adam Weber is a 47 y.o. male is here for an colonoscopy.   Past Medical History:  Diagnosis Date   History of chicken pox    History of hyperlipidemia    Sleep apnea    Thyroid disease     Past Surgical History:  Procedure Laterality Date   APPENDECTOMY  12/15/2009   ELBOW SURGERY Right 2015   HAND SURGERY  12/16/1995    Prior to Admission medications   Medication Sig Start Date End Date Taking? Authorizing Provider  famotidine (PEPCID) 20 MG tablet Take 1 tablet (20 mg total) by mouth 2 (two) times daily. 12/09/22  Yes Tower, Wynelle Fanny, MD  fenofibrate 54 MG tablet Take 1 tablet (54 mg total) by mouth daily. 12/09/22  Yes Tower, Wynelle Fanny, MD  indomethacin (INDOCIN) 50 MG capsule Take 1 capsule (50 mg total) by mouth 3 (three) times daily as needed for moderate pain (toe pain). With a meal 09/30/22  Yes Tower, Wynelle Fanny, MD  levocetirizine (XYZAL) 5 MG tablet TAKE 1 TABLET BY MOUTH EVERY DAY IN THE EVENING 09/16/22  Yes Tower, Wynelle Fanny, MD  levothyroxine (SYNTHROID) 125 MCG tablet Take 1 tablet (125 mcg total) by mouth daily before breakfast. 12/09/22  Yes Tower, Wynelle Fanny, MD    Allergies as of 12/19/2022   (No Known Allergies)    Family History  Problem Relation Age of Onset   Healthy Mother    Healthy Father    Hyperlipidemia Maternal Grandmother    Stroke Maternal Grandmother    Hypertension Maternal Grandmother    Diabetes Paternal Grandmother        well controlled with good diet   Cancer - Lung Paternal Grandfather     Social History   Socioeconomic History   Marital status: Married    Spouse name: Not on file   Number of children: Not on file   Years of education: Not on file   Highest  education level: Not on file  Occupational History   Not on file  Tobacco Use   Smoking status: Never    Passive exposure: Past   Smokeless tobacco: Never  Vaping Use   Vaping Use: Never used  Substance and Sexual Activity   Alcohol use: Yes    Alcohol/week: 0.0 standard drinks of alcohol    Comment: occ   Drug use: No   Sexual activity: Not on file  Other Topics Concern   Not on file  Social History Narrative   Not on file   Social Determinants of Health   Financial Resource Strain: Not on file  Food Insecurity: Not on file  Transportation Needs: Not on file  Physical Activity: Not on file  Stress: Not on file  Social Connections: Not on file  Intimate Partner Violence: Not on file    Review of Systems: See HPI, otherwise negative ROS  Physical Exam: BP (!) 141/93   Pulse (!) 57   Temp 98.6 F (37 C) (Temporal)   Resp 16   Ht 6\' 1"  (1.854 m)   Wt 101.6 kg   SpO2 100%   BMI 29.55 kg/m  General:   Alert,  pleasant and cooperative in NAD Head:  Normocephalic and atraumatic. Neck:  Supple;  no masses or thyromegaly. Lungs:  Clear throughout to auscultation.    Heart:  Regular rate and rhythm. Abdomen:  Soft, nontender and nondistended. Normal bowel sounds, without guarding, and without rebound.   Neurologic:  Alert and  oriented x4;  grossly normal neurologically.  Impression/Plan: Adam Weber is here for an colonoscopy to be performed for colon cancer screening  Risks, benefits, limitations, and alternatives regarding  colonoscopy have been reviewed with the patient.  Questions have been answered.  All parties agreeable.   Sherri Sear, MD  03/09/2023, 7:40 AM

## 2023-03-09 NOTE — Op Note (Signed)
[facility] Gastroenterology Patient Name: Adam Weber Procedure Date: 03/09/2023 7:40 AM MRN: [id number] Account #: [id number] Date of Birth: 1976-09-05 Admit Type: Outpatient Age: 47 Room: [facility] ENDO ROOM 4 Gender: Male Note Status: Finalized Instrument Name: Park Meo [id number] Procedure:             Colonoscopy Indications:           Screening for colorectal malignant neoplasm, This is                         the patient's first colonoscopy Providers:             Lin Landsman MD, MD Referring MD:          Wynelle Fanny. Tower (Referring MD) Medicines:             General Anesthesia Complications:         No immediate complications. Estimated blood loss: None. Procedure:             Pre-Anesthesia Assessment:                        - Prior to the procedure, a History and Physical was                         performed, and patient medications and allergies were                         reviewed. The patient is competent. The risks and                         benefits of the procedure and the sedation options and                         risks were discussed with the patient. All questions                         were answered and informed consent was obtained.                         Patient identification and proposed procedure were                         verified by the physician, the nurse, the                         anesthesiologist, the anesthetist and the technician                         in the pre-procedure area in the procedure room in the                         endoscopy suite. Mental Status Examination: alert and                         oriented. Airway Examination: normal oropharyngeal                         airway and neck mobility. Respiratory Examination:  clear to auscultation. CV Examination: normal.                         Prophylactic Antibiotics: The patient does not require                         prophylactic  antibiotics. Prior Anticoagulants: The                         patient has taken no anticoagulant or antiplatelet                         agents. ASA Grade Assessment: II - A patient with mild                         systemic disease. After reviewing the risks and                         benefits, the patient was deemed in satisfactory                         condition to undergo the procedure. The anesthesia                         plan was to use general anesthesia. Immediately prior                         to administration of medications, the patient was                         re-assessed for adequacy to receive sedatives. The                         heart rate, respiratory rate, oxygen saturations,                         blood pressure, adequacy of pulmonary ventilation, and                         response to care were monitored throughout the                         procedure. The physical status of the patient was                         re-assessed after the procedure.                        After obtaining informed consent, the colonoscope was                         passed under direct vision. Throughout the procedure,                         the patient's blood pressure, pulse, and oxygen                         saturations were monitored continuously. The  Colonoscope was introduced through the anus and                         advanced to the 20 cm into the ileum. The colonoscopy                         was performed without difficulty. The patient                         tolerated the procedure well. The quality of the bowel                         preparation was evaluated using the BBPS [facility] Bowel                         Preparation Scale) with scores of: Right Colon = 3,                         Transverse Colon = 3 and Left Colon = 3 (entire mucosa                         seen well with no residual staining, small fragments                          of stool or opaque liquid). The total BBPS score                         equals 9. The terminal ileum, ileocecal valve,                         appendiceal orifice, and rectum were photographed. Findings:      The perianal and digital rectal examinations were normal. Pertinent       negatives include normal sphincter tone and no palpable rectal lesions.      The terminal ileum appeared normal.      A diffuse area of mildly erythematous mucosa was found in the distal       rectum. Biopsies were taken with a cold forceps for histology.      Normal mucosa was found in the entire colon.      The retroflexed view of the distal rectum and anal verge was normal and       showed no anal or rectal abnormalities. Impression:            - The examined portion of the ileum was normal.                        - Erythematous mucosa in the distal rectum. Biopsied.                        - Normal mucosa in the entire examined colon.                        - The distal rectum and anal verge are normal on                         retroflexion view. Recommendation:        - Discharge  patient to home (with escort).                        - Resume previous diet today.                        - Continue present medications.                        - Await pathology results.                        - Repeat colonoscopy in 10 years for screening                         purposes. Procedure Code(s):     --- Professional ---                        [phone], Colonoscopy, flexible; with biopsy, single or                         multiple Diagnosis Code(s):     --- Professional ---                        Z12.11, Encounter for screening for malignant neoplasm                         of colon                        K62.89, Other specified diseases of anus and rectum CPT copyright 2022 American Medical Association. All rights reserved. The codes documented in this report are preliminary and upon coder review may  be revised  to meet current compliance requirements. Dr. Ulyess Mort Lin Landsman MD, MD 03/09/2023 8:20:05 AM This report has been signed electronically. Number of Addenda: 0 Note Initiated On: 03/09/2023 7:40 AM Scope Withdrawal Time: 0 hours 11 minutes 51 seconds  Total Procedure Duration: 0 hours 14 minutes 57 seconds  Estimated Blood Loss:  Estimated blood loss: none.      [facility]

## 2023-03-09 NOTE — Anesthesia Postprocedure Evaluation (Signed)
Anesthesia Post Note  Patient: Adam Weber  Procedure(s) Performed: COLONOSCOPY WITH PROPOFOL  Patient location during evaluation: Endoscopy Anesthesia Type: General Level of consciousness: awake and alert Pain management: pain level controlled Vital Signs Assessment: post-procedure vital signs reviewed and stable Respiratory status: spontaneous breathing, nonlabored ventilation, respiratory function stable and patient connected to nasal cannula oxygen Cardiovascular status: blood pressure returned to baseline and stable Postop Assessment: no apparent nausea or vomiting Anesthetic complications: no   No notable events documented.   Last Vitals:  Vitals:   03/09/23 0816 03/09/23 0826  BP: 106/63 117/75  Pulse: 65   Resp: 10   Temp: (!) 36.4 C   SpO2: 97%     Last Pain:  Vitals:   03/09/23 0826  TempSrc:   PainSc: 0-No pain                 Arita Miss

## 2023-03-09 NOTE — Transfer of Care (Signed)
Immediate Anesthesia Transfer of Care Note  Patient: Adam Weber  Procedure(s) Performed: COLONOSCOPY WITH PROPOFOL  Patient Location: PACU and Endoscopy Unit  Anesthesia Type:General  Level of Consciousness: drowsy and patient cooperative  Airway & Oxygen Therapy: Patient Spontanous Breathing  Post-op Assessment: Report given to RN and Post -op Vital signs reviewed and stable  Post vital signs: Reviewed and stable  Last Vitals:  Vitals Value Taken Time  BP 106/63 03/09/23 0816  Temp    Pulse 63 03/09/23 0816  Resp 17 03/09/23 0816  SpO2 98 % 03/09/23 0816  Vitals shown include unvalidated device data.  Last Pain:  Vitals:   03/09/23 0702  TempSrc: Temporal  PainSc: 0-No pain         Complications: No notable events documented.

## 2023-03-09 NOTE — Anesthesia Preprocedure Evaluation (Signed)
Anesthesia Evaluation  Patient identified by MRN, date of birth, ID band Patient awake    Reviewed: Allergy & Precautions, NPO status , Patient's Chart, lab work & pertinent test results  History of Anesthesia Complications Negative for: history of anesthetic complications  Airway Mallampati: I  TM Distance: >3 FB Neck ROM: Full    Dental no notable dental hx. (+) Teeth Intact   Pulmonary sleep apnea and Continuous Positive Airway Pressure Ventilation , neg COPD, Patient abstained from smoking.Not current smoker   Pulmonary exam normal breath sounds clear to auscultation       Cardiovascular Exercise Tolerance: Good METS(-) hypertension(-) CAD and (-) Past MI negative cardio ROS (-) dysrhythmias  Rhythm:Regular Rate:Normal - Systolic murmurs    Neuro/Psych negative neurological ROS  negative psych ROS   GI/Hepatic ,neg GERD  ,,(+)     (-) substance abuse    Endo/Other  neg diabetesHypothyroidism    Renal/GU negative Renal ROS     Musculoskeletal   Abdominal   Peds  Hematology   Anesthesia Other Findings Past Medical History: No date: History of chicken pox No date: History of hyperlipidemia No date: Sleep apnea No date: Thyroid disease  Reproductive/Obstetrics                             Anesthesia Physical Anesthesia Plan  ASA: 2  Anesthesia Plan: General   Post-op Pain Management: Minimal or no pain anticipated   Induction: Intravenous  PONV Risk Score and Plan: 2 and Propofol infusion, TIVA and Ondansetron  Airway Management Planned: Nasal Cannula  Additional Equipment: None  Intra-op Plan:   Post-operative Plan:   Informed Consent: I have reviewed the patients History and Physical, chart, labs and discussed the procedure including the risks, benefits and alternatives for the proposed anesthesia with the patient or authorized representative who has indicated his/her  understanding and acceptance.     Dental advisory given  Plan Discussed with: CRNA and Surgeon  Anesthesia Plan Comments: (Discussed risks of anesthesia with patient, including possibility of difficulty with spontaneous ventilation under anesthesia necessitating airway intervention, PONV, and rare risks such as cardiac or respiratory or neurological events, and allergic reactions. Discussed the role of CRNA in patient's perioperative care. Patient understands.)       Anesthesia Quick Evaluation

## 2023-03-10 ENCOUNTER — Telehealth: Payer: Self-pay

## 2023-03-10 ENCOUNTER — Encounter: Payer: Self-pay | Admitting: Gastroenterology

## 2023-03-10 LAB — SURGICAL PATHOLOGY

## 2023-03-10 NOTE — Telephone Encounter (Signed)
-----   Message from Lin Landsman, MD sent at 03/10/2023  3:05 PM EDT ----- Please inform patient that the pathology results from recent colonoscopy, the rectal biopsies came back normal  RV

## 2023-03-10 NOTE — Telephone Encounter (Signed)
Patient verbalized understanding of results  

## 2023-03-12 ENCOUNTER — Other Ambulatory Visit: Payer: Self-pay | Admitting: Family Medicine

## 2023-03-12 DIAGNOSIS — J069 Acute upper respiratory infection, unspecified: Secondary | ICD-10-CM

## 2023-03-20 ENCOUNTER — Encounter: Payer: Self-pay | Admitting: Gastroenterology

## 2023-05-26 ENCOUNTER — Ambulatory Visit: Payer: BC Managed Care – PPO | Admitting: Family Medicine

## 2023-05-26 ENCOUNTER — Encounter: Payer: Self-pay | Admitting: Family Medicine

## 2023-05-26 VITALS — BP 110/70 | HR 47 | Temp 97.7°F | Ht 72.25 in | Wt 202.4 lb

## 2023-05-26 DIAGNOSIS — Z0110 Encounter for examination of ears and hearing without abnormal findings: Secondary | ICD-10-CM | POA: Insufficient documentation

## 2023-05-26 NOTE — Progress Notes (Signed)
Patient ID: Adam Weber, male    DOB: June 05, 1976, 47 y.o.   MRN: [id number]  This visit was conducted in person.  BP 110/70 (BP Location: Left Arm, Patient Position: Sitting, Cuff Size: Large)   Pulse (!) 47   Temp 97.7 F (36.5 C) (Temporal)   Ht 6' 0.25" (1.835 m)   Wt 202 lb 6 oz (91.8 kg)   SpO2 96%   BMI 27.26 kg/m    CC:  Chief Complaint  Patient presents with   Bug in Right Ear    Subjective:   HPI: Adam Weber is a 47 y.o. male presenting on 05/26/2023 for Bug in Right Ear   At recent [employer]... felt bug fly in ear 3 days ago.  He has noted  slight itching in right ear.  No pain, no pressure.  No fever. No allergy symptoms.        Relevant past medical, surgical, family and social history reviewed and updated as indicated. Interim medical history since our last visit reviewed. Allergies and medications reviewed and updated. Outpatient Medications Prior to Visit  Medication Sig Dispense Refill   famotidine (PEPCID) 20 MG tablet Take 1 tablet (20 mg total) by mouth 2 (two) times daily. 180 tablet 3   fenofibrate 54 MG tablet Take 1 tablet (54 mg total) by mouth daily. 90 tablet 3   indomethacin (INDOCIN) 50 MG capsule Take 1 capsule (50 mg total) by mouth 3 (three) times daily as needed for moderate pain (toe pain). With a meal 20 capsule 0   levocetirizine (XYZAL) 5 MG tablet TAKE 1 TABLET BY MOUTH EVERY DAY IN THE EVENING 90 tablet 1   levothyroxine (SYNTHROID) 125 MCG tablet Take 1 tablet (125 mcg total) by mouth daily before breakfast. 90 tablet 2   No facility-administered medications prior to visit.     Per HPI unless specifically indicated in ROS section below Review of Systems  Constitutional:  Negative for fatigue and fever.  HENT:  Negative for congestion, ear discharge and ear pain.   Eyes:  Negative for pain.  Respiratory:  Negative for cough and shortness of breath.   Cardiovascular:  Negative for chest pain,  palpitations and leg swelling.  Gastrointestinal:  Negative for abdominal pain.  Genitourinary:  Negative for dysuria.  Musculoskeletal:  Negative for arthralgias.  Neurological:  Negative for syncope, light-headedness and headaches.  Psychiatric/Behavioral:  Negative for dysphoric mood.    Objective:  BP 110/70 (BP Location: Left Arm, Patient Position: Sitting, Cuff Size: Large)   Pulse (!) 47   Temp 97.7 F (36.5 C) (Temporal)   Ht 6' 0.25" (1.835 m)   Wt 202 lb 6 oz (91.8 kg)   SpO2 96%   BMI 27.26 kg/m   Wt Readings from Last 3 Encounters:  05/26/23 202 lb 6 oz (91.8 kg)  03/09/23 224 lb (101.6 kg)  12/09/22 240 lb 4 oz (109 kg)      Physical Exam Constitutional:      Appearance: He is well-developed.  HENT:     Head: Normocephalic.     Right Ear: Hearing normal.     Left Ear: Hearing normal.     Nose: Nose normal.  Neck:     Thyroid: No thyroid mass or thyromegaly.     Vascular: No carotid bruit.     Trachea: Trachea normal.  Cardiovascular:     Rate and Rhythm: Normal rate and regular rhythm.     Pulses: Normal pulses.  Heart sounds: Heart sounds not distant. No murmur heard.    No friction rub. No gallop.     Comments: No peripheral edema Pulmonary:     Effort: Pulmonary effort is normal. No respiratory distress.     Breath sounds: Normal breath sounds.  Skin:    General: Skin is warm and dry.     Findings: No rash.  Psychiatric:        Speech: Speech normal.        Behavior: Behavior normal.        Thought Content: Thought content normal.       Results for orders placed or performed during the hospital encounter of 03/09/23  Surgical pathology  Result Value Ref Range   SURGICAL PATHOLOGY      SURGICAL PATHOLOGY CASE: [id number] PATIENT: Adam Weber Surgical Pathology Report     Specimen Submitted: A. Rectum, distal; cbx  Clinical History: Screening colonoscopy.  Distal rectal erythema, otherwise normal  colonoscopy    DIAGNOSIS: A. RECTUM, DISTAL; COLD BIOPSY: - BENIGN RECTAL MUCOSA WITH SUPERFICIAL HYPEREMIA AND MILD REACTIVE CHANGES. - NEGATIVE FOR ACTIVE MUCOSAL PROCTITIS, DYSPLASIA, AND MALIGNANCY.  Comment: The histologic changes are relatively nonspecific, but may suggest potential early prolapse type changes, sampling adjacent to diverticular disease, or other benign reactive changes.  Clinical and endoscopic correlation is recommended.  GROSS DESCRIPTION: A. Labeled: Distal rectum erythema cbxs Received: Formalin Collection time: 8:11 AM on 03/09/2023 Placed into formalin time: 8:11 AM on 03/09/2023 Tissue fragment(s): 3 Size: Aggregate, 0.5 x 0.5 x 0.1 cm Description: Pink soft tissue fragments Entirely submitted in 1 cassett e.  CM 03/09/2023  Final Diagnosis performed by Katherine Mantle, MD.   Electronically signed 03/10/2023 11:13:26AM The electronic signature indicates that the named Attending Pathologist has evaluated the specimen Technical component performed at [facility], [address] [place], [place] [address] Lab: [phone] Dir: Jolene Schimke, MD, MMM  Professional component performed at [facility], [facility], [address] [place], [place], [place] [address] Lab: [phone] Dir: Beryle Quant, MD     Assessment and Plan  Normal ear exam Assessment & Plan: No new issues noted.  Bug not noted.  Slight irritation patient notes is likely due to temporary presence of bug.  Recommended topical cortisone 10 cream on fingertip at applied to external ear canal if needed for itching twice daily.     No follow-ups on file.   Kerby Nora, MD

## 2023-05-26 NOTE — Assessment & Plan Note (Signed)
No new issues noted.  Bug not noted.  Slight irritation patient notes is likely due to temporary presence of bug.  Recommended topical cortisone 10 cream on fingertip at applied to external ear canal if needed for itching twice daily.

## 2023-07-30 ENCOUNTER — Ambulatory Visit: Payer: BC Managed Care – PPO | Admitting: Family Medicine

## 2023-07-30 ENCOUNTER — Telehealth: Payer: Self-pay | Admitting: Family Medicine

## 2023-07-30 ENCOUNTER — Encounter: Payer: Self-pay | Admitting: Family Medicine

## 2023-07-30 VITALS — BP 124/80 | HR 50 | Temp 97.6°F | Ht 72.25 in | Wt 190.1 lb

## 2023-07-30 DIAGNOSIS — E039 Hypothyroidism, unspecified: Secondary | ICD-10-CM | POA: Diagnosis not present

## 2023-07-30 DIAGNOSIS — G4730 Sleep apnea, unspecified: Secondary | ICD-10-CM | POA: Diagnosis not present

## 2023-07-30 DIAGNOSIS — R634 Abnormal weight loss: Secondary | ICD-10-CM | POA: Diagnosis not present

## 2023-07-30 DIAGNOSIS — E781 Pure hyperglyceridemia: Secondary | ICD-10-CM | POA: Diagnosis not present

## 2023-07-30 NOTE — Assessment & Plan Note (Signed)
No longer needs cpap after large weight loss Good sleep  Wakes rested

## 2023-07-30 NOTE — Assessment & Plan Note (Signed)
Down from 224 to 190 since march With healthy diet/exercise  Less sugar and no sweetened drinks  Commended Feels better No longer needs cpap Less joint symptoms  Discussed goals for more lean muscle Encouraged to keep up the good work

## 2023-07-30 NOTE — Telephone Encounter (Signed)
Pt needs an appt to discuss this and Dr. Milinda Antis needs to eval him to decide what labs would be best to order. Please schedule an appt with PCP

## 2023-07-30 NOTE — Telephone Encounter (Signed)
Spoke to pt, scheduled ov for today, 8/15 @ 12pm

## 2023-07-30 NOTE — Patient Instructions (Addendum)
Great job with diet and exercise  Keep it up   Try to get most of your carbohydrates from produce (with the exception of white potatoes) and whole grains Eat less bread/pasta/rice/snack foods/cereals/sweets and other items from the middle of the grocery store (processed carbs)  Schedule fasting labs when convenient   I do not think you need to use cpap as long as your weight remains stable

## 2023-07-30 NOTE — Progress Notes (Signed)
Subjective:    Patient ID: Adam Weber, male    DOB: 09-Sep-1976, 47 y.o.   MRN: [id number]  HPI  Wt Readings from Last 3 Encounters:  07/30/23 190 lb 2 oz (86.2 kg)  05/26/23 202 lb 6 oz (91.8 kg)  03/09/23 224 lb (101.6 kg)   25.61 kg/m  Vitals:   07/30/23 1147  BP: 124/80  Pulse: (!) 50  Temp: 97.6 F (36.4 C)  SpO2: 98%    Pt presents to discuss weight loss   Last time we talked -started cutting out sugar drinks  Only water since march - cut a lot of calories   Happy with this  Trying to maintain   More energy Joints are better Flexibility is better     Now wife is concerned that he lost so much weight so fast   Eggs/ bacon this am  Usually almond milk/ cheerios or oatmeal  Lots of veggies  Chicken  Avoids red meat  Lean pork (occational bacon, not usually)  Less white food  Less white potato  No pasta or rice    Exercising /working out  Walking with running  [employer]  Crunches and core work  Doing some with his son     Mild sleep apnea Using cpap in the past- has stopped it  No more snoring or fatigue now with the weight loss  Wakes up rested and sleeps great     Needs letter for [facility] that he no longer needs cpap      Hypothyroid  Lab Results  Component Value Date   TSH 5.13 12/02/2022   Taking levothyroxine 125 mcg daily   Had a colonoscopy 02/2023 normal    Patient Active Problem List   Diagnosis Date Noted   Weight loss 07/30/2023   Normal ear exam 05/26/2023   Erythema of rectum 03/09/2023   Encounter for screening colonoscopy 03/09/2023   Colon cancer screening 12/09/2022   Pre-syncope 04/16/2022   Mild sleep apnea 12/04/2021   Routine general medical examination at a health care facility 07/31/2019   IBS (irritable bowel syndrome) 12/27/2014   Seasonal allergies 06/01/2013   Hypertriglyceridemia 05/17/2013   Hypothyroidism 05/17/2013   Past Medical History:  Diagnosis Date   History of  chicken pox    History of hyperlipidemia    Sleep apnea    Thyroid disease    Past Surgical History:  Procedure Laterality Date   APPENDECTOMY  12/15/2009   COLONOSCOPY WITH PROPOFOL N/A 03/09/2023   Procedure: COLONOSCOPY WITH PROPOFOL;  Surgeon: Toney Reil, MD;  Location: [facility] ENDOSCOPY;  Service: Gastroenterology;  Laterality: N/A;   ELBOW SURGERY Right 2015   HAND SURGERY  12/16/1995   Social History   Tobacco Use   Smoking status: Never    Passive exposure: Past   Smokeless tobacco: Never  Vaping Use   Vaping status: Never Used  Substance Use Topics   Alcohol use: Yes    Alcohol/week: 0.0 standard drinks of alcohol    Comment: occ   Drug use: No   Family History  Problem Relation Age of Onset   Healthy Mother    Healthy Father    Hyperlipidemia Maternal Grandmother    Stroke Maternal Grandmother    Hypertension Maternal Grandmother    Diabetes Paternal Grandmother        well controlled with good diet   Cancer - Lung Paternal Grandfather    No Known Allergies Current Outpatient Medications on File Prior to Visit  Medication Sig Dispense Refill   famotidine (PEPCID) 20 MG tablet Take 1 tablet (20 mg total) by mouth 2 (two) times daily. 180 tablet 3   fenofibrate 54 MG tablet Take 1 tablet (54 mg total) by mouth daily. 90 tablet 3   indomethacin (INDOCIN) 50 MG capsule Take 1 capsule (50 mg total) by mouth 3 (three) times daily as needed for moderate pain (toe pain). With a meal 20 capsule 0   levocetirizine (XYZAL) 5 MG tablet TAKE 1 TABLET BY MOUTH EVERY DAY IN THE EVENING 90 tablet 1   levothyroxine (SYNTHROID) 125 MCG tablet Take 1 tablet (125 mcg total) by mouth daily before breakfast. 90 tablet 2   No current facility-administered medications on file prior to visit.    Review of Systems  Constitutional:  Negative for activity change, appetite change, fatigue, fever and unexpected weight change.  HENT:  Negative for congestion, rhinorrhea, sore  throat and trouble swallowing.   Eyes:  Negative for pain, redness, itching and visual disturbance.  Respiratory:  Negative for cough, chest tightness, shortness of breath and wheezing.   Cardiovascular:  Negative for chest pain and palpitations.  Gastrointestinal:  Negative for abdominal pain, blood in stool, constipation, diarrhea and nausea.  Endocrine: Negative for cold intolerance, heat intolerance, polydipsia and polyuria.  Genitourinary:  Negative for difficulty urinating, dysuria, frequency and urgency.  Musculoskeletal:  Negative for arthralgias, joint swelling and myalgias.  Skin:  Negative for pallor and rash.  Neurological:  Negative for dizziness, tremors, weakness, numbness and headaches.  Hematological:  Negative for adenopathy. Does not bruise/bleed easily.  Psychiatric/Behavioral:  Negative for decreased concentration and dysphoric mood. The patient is not nervous/anxious.        Objective:   Physical Exam Constitutional:      General: He is not in acute distress.    Appearance: Normal appearance. He is well-developed. He is not ill-appearing or diaphoretic.     Comments: Weight loss noted   HENT:     Head: Normocephalic and atraumatic.  Eyes:     Conjunctiva/sclera: Conjunctivae normal.     Pupils: Pupils are equal, round, and reactive to light.  Neck:     Thyroid: No thyromegaly.     Vascular: No carotid bruit or JVD.     Comments: No goiter Cardiovascular:     Rate and Rhythm: Normal rate and regular rhythm.     Heart sounds: Normal heart sounds.     No gallop.  Pulmonary:     Effort: Pulmonary effort is normal. No respiratory distress.     Breath sounds: Normal breath sounds. No wheezing or rales.  Abdominal:     General: There is no distension or abdominal bruit.     Palpations: Abdomen is soft.  Musculoskeletal:     Cervical back: Normal range of motion and neck supple.     Right lower leg: No edema.     Left lower leg: No edema.  Lymphadenopathy:      Cervical: No cervical adenopathy.  Skin:    General: Skin is warm and dry.     Coloration: Skin is not pale.     Findings: No rash.  Neurological:     Mental Status: He is alert.     Coordination: Coordination normal.     Deep Tendon Reflexes: Reflexes are normal and symmetric. Reflexes normal.  Psychiatric:        Mood and Affect: Mood normal.           Assessment &  Plan:   Problem List Items Addressed This Visit       Respiratory   Mild sleep apnea    No longer needs cpap after large weight loss Good sleep  Wakes rested         Endocrine   Hypothyroidism    TSH ordered Has had a significant intentional weight loss  Taking lovothy 125 mcg daily       Relevant Orders   TSH     Other   Weight loss - Primary    Down from 224 to 190 since march With healthy diet/exercise  Less sugar and no sweetened drinks  Commended Feels better No longer needs cpap Less joint symptoms  Discussed goals for more lean muscle Encouraged to keep up the good work      Hypertriglyceridemia    Eating better overall  Still taking fenofibrate 54 mg daily   Disc goals for lipids and reasons to control them Rev last labs with pt Rev low sat fat diet in detail Labs planned fasting       Relevant Orders   Lipid panel

## 2023-07-30 NOTE — Telephone Encounter (Signed)
Patient called in and stated that he was losing weight. He wanted to know if he could get some lab work done to see if something is going on or if his medication could be causing the weight lose. He would like a call back.

## 2023-07-30 NOTE — Assessment & Plan Note (Signed)
TSH ordered Has had a significant intentional weight loss  Taking lovothy 125 mcg daily

## 2023-07-30 NOTE — Assessment & Plan Note (Signed)
Eating better overall  Still taking fenofibrate 54 mg daily   Disc goals for lipids and reasons to control them Rev last labs with pt Rev low sat fat diet in detail Labs planned fasting

## 2023-08-03 ENCOUNTER — Other Ambulatory Visit (INDEPENDENT_AMBULATORY_CARE_PROVIDER_SITE_OTHER): Payer: BC Managed Care – PPO

## 2023-08-03 DIAGNOSIS — E781 Pure hyperglyceridemia: Secondary | ICD-10-CM | POA: Diagnosis not present

## 2023-08-03 DIAGNOSIS — E039 Hypothyroidism, unspecified: Secondary | ICD-10-CM | POA: Diagnosis not present

## 2023-08-03 LAB — LIPID PANEL
Cholesterol: 185 mg/dL (ref 0–200)
HDL: 45.2 mg/dL (ref >39.00)
LDL Cholesterol: 125 mg/dL — ABNORMAL HIGH (ref 0–99)
NonHDL: 140.15
Total CHOL/HDL Ratio: 4
Triglycerides: 77 mg/dL (ref 0.0–149.0)
VLDL: 15.4 mg/dL (ref 0.0–40.0)

## 2023-08-03 LAB — TSH: TSH: 0.22 u[IU]/mL — ABNORMAL LOW (ref 0.35–5.50)

## 2023-08-03 NOTE — Addendum Note (Signed)
Addended by: Roxy Manns A on: 08/03/2023 07:56 PM   Modules accepted: Orders

## 2023-08-04 ENCOUNTER — Telehealth: Payer: Self-pay | Admitting: *Deleted

## 2023-08-04 MED ORDER — LEVOTHYROXINE SODIUM 100 MCG PO TABS: 100.0000 ug | ORAL_TABLET | Freq: Every day | ORAL | 1 refills | Status: DC

## 2023-08-04 NOTE — Telephone Encounter (Signed)
-----   Message from [facility] sent at 08/03/2023  7:55 PM EDT ----- TSH is low so we need to go down on levothyroxine dose  Currently 125 mcg daily  Please send in levothyroxine 100 mcg daily #30 1 refill  Re check TSH in about 6 weeks please  Cholesterol looks good Triglycerides down to 77 and LDL stable in 120s HDL is improved from exercise  Keep up the good work

## 2023-08-04 NOTE — Telephone Encounter (Signed)
Pt viewed results on mychart. I also went over them with him. New Rx sent to pharmacy and f/u lab appt scheduled

## 2023-08-27 ENCOUNTER — Other Ambulatory Visit: Payer: Self-pay | Admitting: Family Medicine

## 2023-09-13 ENCOUNTER — Other Ambulatory Visit: Payer: Self-pay | Admitting: Family Medicine

## 2023-09-13 DIAGNOSIS — J069 Acute upper respiratory infection, unspecified: Secondary | ICD-10-CM

## 2023-09-16 ENCOUNTER — Other Ambulatory Visit (INDEPENDENT_AMBULATORY_CARE_PROVIDER_SITE_OTHER): Payer: BC Managed Care – PPO

## 2023-09-16 DIAGNOSIS — E039 Hypothyroidism, unspecified: Secondary | ICD-10-CM

## 2023-09-16 LAB — TSH: TSH: 4.54 u[IU]/mL (ref 0.35–5.50)

## 2023-09-17 ENCOUNTER — Other Ambulatory Visit: Payer: Self-pay | Admitting: *Deleted

## 2023-09-17 ENCOUNTER — Encounter: Payer: Self-pay | Admitting: *Deleted

## 2023-09-17 MED ORDER — LEVOTHYROXINE SODIUM 100 MCG PO TABS: 100.0000 ug | ORAL_TABLET | Freq: Every day | ORAL | 2 refills | Status: DC

## 2023-12-09 ENCOUNTER — Telehealth: Payer: Self-pay | Admitting: Family Medicine

## 2023-12-09 DIAGNOSIS — E039 Hypothyroidism, unspecified: Secondary | ICD-10-CM

## 2023-12-09 DIAGNOSIS — E781 Pure hyperglyceridemia: Secondary | ICD-10-CM

## 2023-12-09 DIAGNOSIS — Z0000 Encounter for general adult medical examination without abnormal findings: Secondary | ICD-10-CM

## 2023-12-09 NOTE — Telephone Encounter (Signed)
-----   Message from Alvina Chou sent at 11/20/2023  2:54 PM EST ----- Regarding: Lab orders for Adam Weber, 12.26.24 Patient is scheduled for CPX labs, please order future labs, Thanks , Camelia Eng

## 2023-12-10 ENCOUNTER — Other Ambulatory Visit: Payer: BC Managed Care – PPO

## 2023-12-10 DIAGNOSIS — E781 Pure hyperglyceridemia: Secondary | ICD-10-CM | POA: Diagnosis not present

## 2023-12-10 DIAGNOSIS — Z0000 Encounter for general adult medical examination without abnormal findings: Secondary | ICD-10-CM

## 2023-12-10 DIAGNOSIS — E039 Hypothyroidism, unspecified: Secondary | ICD-10-CM | POA: Diagnosis not present

## 2023-12-10 LAB — COMPREHENSIVE METABOLIC PANEL
ALT: 17 U/L (ref 0–53)
AST: 18 U/L (ref 0–37)
Albumin: 4.4 g/dL (ref 3.5–5.2)
Alkaline Phosphatase: 55 U/L (ref 39–117)
BUN: 16 mg/dL (ref 6–23)
CO2: 29 meq/L (ref 19–32)
Calcium: 9.1 mg/dL (ref 8.4–10.5)
Chloride: 105 meq/L (ref 96–112)
Creatinine, Ser: 1.12 mg/dL (ref 0.40–1.50)
GFR: 78.99 mL/min (ref >60.00)
Glucose, Bld: 89 mg/dL (ref 70–99)
Potassium: 4.5 meq/L (ref 3.5–5.1)
Sodium: 140 meq/L (ref 135–145)
Total Bilirubin: 0.6 mg/dL (ref 0.2–1.2)
Total Protein: 6.3 g/dL (ref 6.0–8.3)

## 2023-12-10 LAB — LIPID PANEL
Cholesterol: 145 mg/dL (ref 0–200)
HDL: 47.3 mg/dL (ref >39.00)
LDL Cholesterol: 81 mg/dL (ref 0–99)
NonHDL: 98.03
Total CHOL/HDL Ratio: 3
Triglycerides: 83 mg/dL (ref 0.0–149.0)
VLDL: 16.6 mg/dL (ref 0.0–40.0)

## 2023-12-10 LAB — CBC WITH DIFFERENTIAL/PLATELET
Basophils Absolute: 0 10*3/uL (ref 0.0–0.1)
Basophils Relative: 0.8 % (ref 0.0–3.0)
Eosinophils Absolute: 0.2 10*3/uL (ref 0.0–0.7)
Eosinophils Relative: 2.8 % (ref 0.0–5.0)
HCT: 46.3 % (ref 39.0–52.0)
Hemoglobin: 15.8 g/dL (ref 13.0–17.0)
Lymphocytes Relative: 47.3 % — ABNORMAL HIGH (ref 12.0–46.0)
Lymphs Abs: 2.8 10*3/uL (ref 0.7–4.0)
MCHC: 34.1 g/dL (ref 30.0–36.0)
MCV: 88.4 fL (ref 78.0–100.0)
Monocytes Absolute: 0.4 10*3/uL (ref 0.1–1.0)
Monocytes Relative: 6.9 % (ref 3.0–12.0)
Neutro Abs: 2.5 10*3/uL (ref 1.4–7.7)
Neutrophils Relative %: 42.2 % — ABNORMAL LOW (ref 43.0–77.0)
Platelets: 247 10*3/uL (ref 150.0–400.0)
RBC: 5.24 Mil/uL (ref 4.22–5.81)
RDW: 12.9 % (ref 11.5–15.5)
WBC: 5.8 10*3/uL (ref 4.0–10.5)

## 2023-12-10 LAB — TSH: TSH: 10.33 u[IU]/mL — ABNORMAL HIGH (ref 0.35–5.50)

## 2023-12-17 ENCOUNTER — Encounter: Payer: Self-pay | Admitting: Family Medicine

## 2023-12-17 ENCOUNTER — Ambulatory Visit (INDEPENDENT_AMBULATORY_CARE_PROVIDER_SITE_OTHER): Payer: BC Managed Care – PPO | Admitting: Family Medicine

## 2023-12-17 ENCOUNTER — Other Ambulatory Visit: Payer: Self-pay | Admitting: Family Medicine

## 2023-12-17 VITALS — BP 122/84 | HR 48 | Temp 97.6°F | Ht 72.0 in | Wt 185.5 lb

## 2023-12-17 DIAGNOSIS — R634 Abnormal weight loss: Secondary | ICD-10-CM

## 2023-12-17 DIAGNOSIS — E781 Pure hyperglyceridemia: Secondary | ICD-10-CM

## 2023-12-17 DIAGNOSIS — Z1211 Encounter for screening for malignant neoplasm of colon: Secondary | ICD-10-CM | POA: Diagnosis not present

## 2023-12-17 DIAGNOSIS — Z0000 Encounter for general adult medical examination without abnormal findings: Secondary | ICD-10-CM

## 2023-12-17 DIAGNOSIS — E039 Hypothyroidism, unspecified: Secondary | ICD-10-CM

## 2023-12-17 MED ORDER — FENOFIBRATE 54 MG PO TABS: 54.0000 mg | ORAL_TABLET | Freq: Every day | ORAL | 3 refills | Status: DC

## 2023-12-17 MED ORDER — LEVOTHYROXINE SODIUM 100 MCG PO TABS: 100.0000 ug | ORAL_TABLET | Freq: Every day | ORAL | 0 refills | Status: DC

## 2023-12-17 NOTE — Assessment & Plan Note (Signed)
 Lab Results  Component Value Date   TSH 10.33 (H) 12/10/2023   This is up but pt has no clinical change and would rather not change dose Continues levothyroxine  100 mcg daily  Reviewed signs and symptoms of hypothyroid-will call if any develop (sluggish/hair/skin change) Re check TSH in 3 months

## 2023-12-17 NOTE — Assessment & Plan Note (Signed)
 Colonoscopy utd 02/2023 with 10 y recall

## 2023-12-17 NOTE — Assessment & Plan Note (Signed)
 Bmi of 25.1  Improved weight with diet and exercise  Commended

## 2023-12-17 NOTE — Patient Instructions (Addendum)
 Your TSH is up a bit  If you develop fatigue or sluggishness / skin or hair changes let us know and we would increase levothyroxine dose   Otherwise I want to re check it in 3 months   Keep up the good work with diet and exercise

## 2023-12-17 NOTE — Assessment & Plan Note (Signed)
 Reviewed health habits including diet and exercise and skin cancer prevention Reviewed appropriate screening tests for age  Also reviewed health mt list, fam hx and immunization status , as well as social and family history   See HPI Labs reviewed and ordered Declines flu shot  No fam history of prostate cancer Colonoscopy utd 02/2023  Discussed fall prevention, supplements and exercise for bone density  PHq 0   Health Maintenance  Topic Date Due   Flu Shot  03/14/2024*   COVID-19 Vaccine (3 - 2024-25 season) 01/01/2025*   HIV Screening  05/28/2028*   DTaP/Tdap/Td vaccine (2 - Td or Tdap) 06/25/2027   Colon Cancer Screening  03/08/2033   Hepatitis C Screening  Completed   HPV Vaccine  Aged Out  *Topic was postponed. The date shown is not the original due date.   Commended great health habits

## 2023-12-17 NOTE — Assessment & Plan Note (Signed)
 Trig are stable and well controlled at 83 with fenofibrate 54 mg daily  LDL is also down to 81 from 125 with better diet  Commended Disc goals for lipids and reasons to control them Rev last labs with pt Rev low sat fat diet in detail

## 2023-12-17 NOTE — Progress Notes (Signed)
 Subjective:    Patient ID: Adam Weber, male    DOB: 1976/01/24, 48 y.o.   MRN: [id number]  HPI  Here for health maintenance exam and to review chronic medical problems   Wt Readings from Last 3 Encounters:  12/17/23 185 lb 8 oz (84.1 kg)  07/30/23 190 lb 2 oz (86.2 kg)  05/26/23 202 lb 6 oz (91.8 kg)   25.16 kg/m  Vitals:   12/17/23 1100  BP: 122/84  Pulse: (!) 48  Temp: 97.6 F (36.4 C)  SpO2: 98%    Immunization History  Administered Date(s) Administered   PFIZER(Purple Top)SARS-COV-2 Vaccination 03/09/2020, 04/04/2020   Tdap 06/24/2017    There are no preventive care reminders to display for this patient.  Declines flu shot   Prostate health No prostate ca in family   No urinary or voiding changes No nocturia   Drinks lots of water    Colon cancer screening  Colonoscopy 02/2023 with a 10 y recall   Bone health   Falls- none  Fractures-none  Supplements - takes fish oil  Almond milk with vitamin D in it    Exercise :  Works out 4 days per week for four more weeks then goes up to 5 days per week  Follows a program  Also very active in general   Still eating very healthy    Mood    12/17/2023   11:04 AM 07/30/2023   11:58 AM 12/09/2022    8:56 AM 12/04/2021    4:20 PM 08/31/2020    3:32 PM  Depression screen PHQ 2/9  Decreased Interest 0 0 0 0 0  Down, Depressed, Hopeless 0 0 0 0 0  PHQ - 2 Score 0 0 0 0 0  Altered sleeping 0 0 0 0 0  Tired, decreased energy 0 0 0 0 0  Change in appetite 0 0 0 0 0  Feeling bad or failure about yourself  0 0 0 0 0  Trouble concentrating 0 0 0 0 0  Moving slowly or fidgety/restless 0 0 0 0 0  Suicidal thoughts 0 0 0 0 0  PHQ-9 Score 0 0 0 0 0  Difficult doing work/chores Not difficult at all Not difficult at all Not difficult at all Not difficult at all Not difficult at all   Pulse Readings from Last 3 Encounters:  12/17/23 (!) 48  07/30/23 (!) 50  05/26/23 (!) 47   This is baseline for him    Feels fine    Hypothyroidism  Pt has no clinical changes No change in energy level/ hair or skin/ edema and no tremor Lab Results  Component Value Date   TSH 10.33 (H) 12/10/2023    Levothyroxine  100 mcg daily  Feeling fine  Not sluggish  Hyperlipidemia/ triglycerides Lab Results  Component Value Date   CHOL 145 12/10/2023   CHOL 185 08/03/2023   CHOL 191 12/02/2022   Lab Results  Component Value Date   HDL 47.30 12/10/2023   HDL 45.20 08/03/2023   HDL 36.50 (L) 12/02/2022   Lab Results  Component Value Date   LDLCALC 81 12/10/2023   LDLCALC 125 (H) 08/03/2023   LDLCALC 126 (H) 12/02/2022   Lab Results  Component Value Date   TRIG 83.0 12/10/2023   TRIG 77.0 08/03/2023   TRIG 144.0 12/02/2022   Lab Results  Component Value Date   CHOLHDL 3 12/10/2023   CHOLHDL 4 08/03/2023   CHOLHDL 5 12/02/2022   Lab Results  Component Value Date   LDLDIRECT 116.0 06/30/2018   LDLDIRECT 86.0 09/15/2016   LDLDIRECT 109.0 04/15/2016   Fenofibrate  54 mg daily  Eating really healthy  LDL down significantly    Other labs Lab Results  Component Value Date   NA 140 12/10/2023   K 4.5 12/10/2023   CO2 29 12/10/2023   GLUCOSE 89 12/10/2023   BUN 16 12/10/2023   CREATININE 1.12 12/10/2023   CALCIUM 9.1 12/10/2023   GFR 78.99 12/10/2023   GFRNONAA >90 12/13/2014   Lab Results  Component Value Date   ALT 17 12/10/2023   AST 18 12/10/2023   ALKPHOS 55 12/10/2023   BILITOT 0.6 12/10/2023   Lab Results  Component Value Date   WBC 5.8 12/10/2023   HGB 15.8 12/10/2023   HCT 46.3 12/10/2023   MCV 88.4 12/10/2023   PLT 247.0 12/10/2023      Patient Active Problem List   Diagnosis Date Noted   Weight loss 07/30/2023   Colon cancer screening 12/09/2022   Mild sleep apnea 12/04/2021   Routine general medical examination at a health care facility 07/31/2019   IBS (irritable bowel syndrome) 12/27/2014   Seasonal allergies 06/01/2013   Hypertriglyceridemia  05/17/2013   Hypothyroidism 05/17/2013   Past Medical History:  Diagnosis Date   History of chicken pox    History of hyperlipidemia    Sleep apnea    Thyroid  disease    Past Surgical History:  Procedure Laterality Date   APPENDECTOMY  12/15/2009   COLONOSCOPY WITH PROPOFOL  N/A 03/09/2023   Procedure: COLONOSCOPY WITH PROPOFOL ;  Surgeon: Unk Corinn Skiff, MD;  Location: [facility] ENDOSCOPY;  Service: Gastroenterology;  Laterality: N/A;   ELBOW SURGERY Right 2015   HAND SURGERY  12/16/1995   Social History   Tobacco Use   Smoking status: Never    Passive exposure: Past   Smokeless tobacco: Never  Vaping Use   Vaping status: Never Used  Substance Use Topics   Alcohol use: Yes    Alcohol/week: 0.0 standard drinks of alcohol    Comment: occ   Drug use: No   Family History  Problem Relation Age of Onset   Healthy Mother    Healthy Father    Hyperlipidemia Maternal Grandmother    Stroke Maternal Grandmother    Hypertension Maternal Grandmother    Diabetes Paternal Grandmother        well controlled with good diet   Cancer - Lung Paternal Grandfather    No Known Allergies Current Outpatient Medications on File Prior to Visit  Medication Sig Dispense Refill   famotidine  (PEPCID ) 20 MG tablet Take 1 tablet (20 mg total) by mouth 2 (two) times daily. 180 tablet 3   indomethacin  (INDOCIN ) 50 MG capsule Take 1 capsule (50 mg total) by mouth 3 (three) times daily as needed for moderate pain (toe pain). With a meal 20 capsule 0   levocetirizine (XYZAL ) 5 MG tablet TAKE 1 TABLET BY MOUTH EVERY DAY IN THE EVENING 90 tablet 1   No current facility-administered medications on file prior to visit.    Review of Systems  Constitutional:  Negative for activity change, appetite change, fatigue, fever and unexpected weight change.  HENT:  Negative for congestion, rhinorrhea, sore throat and trouble swallowing.   Eyes:  Negative for pain, redness, itching and visual disturbance.   Respiratory:  Negative for cough, chest tightness, shortness of breath and wheezing.   Cardiovascular:  Negative for chest pain and palpitations.  Gastrointestinal:  Negative for  abdominal pain, blood in stool, constipation, diarrhea and nausea.  Endocrine: Negative for cold intolerance, heat intolerance, polydipsia and polyuria.  Genitourinary:  Negative for difficulty urinating, dysuria, frequency and urgency.  Musculoskeletal:  Negative for arthralgias, joint swelling and myalgias.  Skin:  Negative for pallor and rash.  Neurological:  Negative for dizziness, tremors, weakness, numbness and headaches.  Hematological:  Negative for adenopathy. Does not bruise/bleed easily.  Psychiatric/Behavioral:  Negative for decreased concentration and dysphoric mood. The patient is not nervous/anxious.        Objective:   Physical Exam Constitutional:      General: He is not in acute distress.    Appearance: Normal appearance. He is well-developed and normal weight. He is not ill-appearing or diaphoretic.  HENT:     Head: Normocephalic and atraumatic.     Right Ear: Tympanic membrane, ear canal and external ear normal.     Left Ear: Tympanic membrane, ear canal and external ear normal.     Nose: Nose normal. No congestion.     Mouth/Throat:     Mouth: Mucous membranes are moist.     Pharynx: Oropharynx is clear. No posterior oropharyngeal erythema.  Eyes:     General: No scleral icterus.       Right eye: No discharge.        Left eye: No discharge.     Conjunctiva/sclera: Conjunctivae normal.     Pupils: Pupils are equal, round, and reactive to light.  Neck:     Thyroid : No thyromegaly.     Vascular: No carotid bruit or JVD.  Cardiovascular:     Rate and Rhythm: Normal rate and regular rhythm.     Pulses: Normal pulses.     Heart sounds: Normal heart sounds.     No gallop.  Pulmonary:     Effort: Pulmonary effort is normal. No respiratory distress.     Breath sounds: Normal breath  sounds. No wheezing or rales.     Comments: Good air exch Chest:     Chest wall: No tenderness.  Abdominal:     General: Bowel sounds are normal. There is no distension or abdominal bruit.     Palpations: Abdomen is soft. There is no mass.     Tenderness: There is no abdominal tenderness.     Hernia: No hernia is present.  Musculoskeletal:        General: No tenderness.     Cervical back: Normal range of motion and neck supple. No rigidity. No muscular tenderness.     Right lower leg: No edema.     Left lower leg: No edema.  Lymphadenopathy:     Cervical: No cervical adenopathy.  Skin:    General: Skin is warm and dry.     Coloration: Skin is not pale.     Findings: No erythema or rash.     Comments: Solar lentigines diffusely    Neurological:     Mental Status: He is alert.     Cranial Nerves: No cranial nerve deficit.     Motor: No abnormal muscle tone.     Coordination: Coordination normal.     Gait: Gait normal.     Deep Tendon Reflexes: Reflexes are normal and symmetric. Reflexes normal.  Psychiatric:        Mood and Affect: Mood normal.        Cognition and Memory: Cognition and memory normal.           Assessment & Plan:   Problem  List Items Addressed This Visit       Endocrine   Hypothyroidism   Lab Results  Component Value Date   TSH 10.33 (H) 12/10/2023   This is up but pt has no clinical change and would rather not change dose Continues levothyroxine  100 mcg daily  Reviewed signs and symptoms of hypothyroid-will call if any develop (sluggish/hair/skin change) Re check TSH in 3 months       Relevant Medications   levothyroxine  (SYNTHROID ) 100 MCG tablet     Other   Weight loss   Bmi of 25.1  Improved weight with diet and exercise  Commended       Routine general medical examination at a health care facility - Primary   Reviewed health habits including diet and exercise and skin cancer prevention Reviewed appropriate screening tests for  age  Also reviewed health mt list, fam hx and immunization status , as well as social and family history   See HPI Labs reviewed and ordered Declines flu shot  No fam history of prostate cancer Colonoscopy utd 02/2023  Discussed fall prevention, supplements and exercise for bone density  PHq 0   Health Maintenance  Topic Date Due   Flu Shot  03/14/2024*   COVID-19 Vaccine (3 - 2024-25 season) 01/01/2025*   HIV Screening  05/28/2028*   DTaP/Tdap/Td vaccine (2 - Td or Tdap) 06/25/2027   Colon Cancer Screening  03/08/2033   Hepatitis C Screening  Completed   HPV Vaccine  Aged Out  *Topic was postponed. The date shown is not the original due date.   Commended great health habits       Hypertriglyceridemia   Trig are stable and well controlled at 83 with fenofibrate  54 mg daily  LDL is also down to 81 from 125 with better diet  Commended Disc goals for lipids and reasons to control them Rev last labs with pt Rev low sat fat diet in detail       Relevant Medications   fenofibrate  54 MG tablet   Colon cancer screening   Colonoscopy utd 02/2023 with 10 y recall

## 2024-03-08 ENCOUNTER — Other Ambulatory Visit: Payer: Self-pay | Admitting: Family Medicine

## 2024-03-08 DIAGNOSIS — J069 Acute upper respiratory infection, unspecified: Secondary | ICD-10-CM

## 2024-03-16 ENCOUNTER — Other Ambulatory Visit (INDEPENDENT_AMBULATORY_CARE_PROVIDER_SITE_OTHER): Payer: 59

## 2024-03-16 ENCOUNTER — Encounter: Payer: Self-pay | Admitting: Family Medicine

## 2024-03-16 DIAGNOSIS — E039 Hypothyroidism, unspecified: Secondary | ICD-10-CM

## 2024-03-16 LAB — TSH: TSH: 6.79 u[IU]/mL — ABNORMAL HIGH (ref 0.35–5.50)

## 2024-06-18 ENCOUNTER — Other Ambulatory Visit: Payer: Self-pay | Admitting: Family Medicine

## 2024-09-11 ENCOUNTER — Other Ambulatory Visit: Payer: Self-pay | Admitting: Family Medicine

## 2024-09-17 ENCOUNTER — Other Ambulatory Visit: Payer: Self-pay | Admitting: Family Medicine

## 2024-09-17 DIAGNOSIS — J069 Acute upper respiratory infection, unspecified: Secondary | ICD-10-CM

## 2024-12-14 ENCOUNTER — Other Ambulatory Visit: Payer: Self-pay | Admitting: Family Medicine

## 2024-12-19 ENCOUNTER — Encounter: Payer: Self-pay | Admitting: Family Medicine

## 2024-12-19 ENCOUNTER — Ambulatory Visit (INDEPENDENT_AMBULATORY_CARE_PROVIDER_SITE_OTHER): Admitting: Family Medicine

## 2024-12-19 VITALS — BP 110/68 | HR 46 | Temp 98.2°F | Ht 72.0 in | Wt 184.6 lb

## 2024-12-19 DIAGNOSIS — E781 Pure hyperglyceridemia: Secondary | ICD-10-CM

## 2024-12-19 DIAGNOSIS — K219 Gastro-esophageal reflux disease without esophagitis: Secondary | ICD-10-CM | POA: Diagnosis not present

## 2024-12-19 DIAGNOSIS — Z0000 Encounter for general adult medical examination without abnormal findings: Secondary | ICD-10-CM

## 2024-12-19 DIAGNOSIS — E039 Hypothyroidism, unspecified: Secondary | ICD-10-CM | POA: Diagnosis not present

## 2024-12-19 DIAGNOSIS — Z1211 Encounter for screening for malignant neoplasm of colon: Secondary | ICD-10-CM

## 2024-12-19 NOTE — Assessment & Plan Note (Signed)
 Takes pepcid  20 mg at bedtime Well controlled Improved after weight loss

## 2024-12-19 NOTE — Assessment & Plan Note (Signed)
 Reviewed health habits including diet and exercise and skin cancer prevention Reviewed appropriate screening tests for age  Also reviewed health mt list, fam hx and immunization status , as well as social and family history   See HPI Labs reviewed and ordered Health Maintenance  Topic Date Due   Hepatitis B Vaccine (1 of 3 - 19+ 3-dose series) Never done   COVID-19 Vaccine (3 - 2025-26 season) 01/04/2025*   Flu Shot  03/14/2025*   HIV Screening  05/28/2028*   DTaP/Tdap/Td vaccine (2 - Td or Tdap) 06/25/2027   Colon Cancer Screening  03/08/2033   Hepatitis C Screening  Completed   Pneumococcal Vaccine  Aged Out   HPV Vaccine  Aged Out   Meningitis B Vaccine  Aged Out  *Topic was postponed. The date shown is not the original due date.    Declines flu shot  No voiding changes or fam history of prostate cancer Great health habits Counseled re: skin cancer prevention  PHQ 0 Discussed fall prevention, supplements and exercise for bone density

## 2024-12-19 NOTE — Assessment & Plan Note (Signed)
 Colonoscopy utd 02/2023 with 10 y recall

## 2024-12-19 NOTE — Patient Instructions (Addendum)
 Schedule early am lab appointment when able   Take care of yourself   Keep up the great job with diet and exercise   Use sun protection for skin cancer prevention

## 2024-12-19 NOTE — Assessment & Plan Note (Signed)
 TSH planned  Taking levothyroxine  100 mcg daily  Last visit TSH up but decided not to increase dose due to no clinical changes

## 2024-12-19 NOTE — Progress Notes (Signed)
 "  Subjective:    Patient ID: Adam Weber, male    DOB: 01-28-1976, 49 y.o.   MRN: [id number]  HPI  Here for health maintenance exam and to review chronic medical problems   Wt Readings from Last 3 Encounters:  12/19/24 184 lb 9.6 oz (83.7 kg)  12/17/23 185 lb 8 oz (84.1 kg)  07/30/23 190 lb 2 oz (86.2 kg)   25.04 kg/m  Vitals:   12/19/24 1531  BP: 110/68  Pulse: (!) 46  Temp: 98.2 F (36.8 C)  SpO2: 99%    Immunization History  Administered Date(s) Administered   PFIZER(Purple Top)SARS-COV-2 Vaccination 03/09/2020, 04/04/2020   Tdap 06/24/2017    Health Maintenance Due  Topic Date Due   Hepatitis B Vaccines 19-59 Average Risk (1 of 3 - 19+ 3-dose series) Never done   Feeling good   Declines flu shot   Prostate health No family history of prostate cancer No voiding changes No nocturia    Colon cancer screening  Colonoscopy 02/2023 with 10 y recall   Bone health   Falls-none  Fractures-none  Supplements - none    Exercise  Cardio Weights   Not light headed with current pulse in 40s    Mood    12/19/2024    3:33 PM 12/17/2023   11:04 AM 07/30/2023   11:58 AM 12/09/2022    8:56 AM 12/04/2021    4:20 PM  Depression screen PHQ 2/9  Decreased Interest 0 0 0 0 0  Down, Depressed, Hopeless 0 0 0 0 0  PHQ - 2 Score 0 0 0 0 0  Altered sleeping 0 0 0 0 0  Tired, decreased energy 0 0 0 0 0  Change in appetite 0 0 0 0 0  Feeling bad or failure about yourself  0 0 0 0 0  Trouble concentrating 0 0 0 0 0  Moving slowly or fidgety/restless 0 0 0 0 0  Suicidal thoughts 0 0 0 0 0  PHQ-9 Score 0 0  0  0  0   Difficult doing work/chores  Not difficult at all Not difficult at all Not difficult at all Not difficult at all     Data saved with a previous flowsheet row definition    Hypothyroidism  Pt has no clinical changes No change in energy level/ hair or skin/ edema and no tremor Lab Results  Component Value Date   TSH 6.79 (H) 03/16/2024   No  clinical change last time-declined change in dose  Levothyroxine  100 mcg daily  Over due for labs   No missed doses   Hyperlipidemia/triglycerides  Lab Results  Component Value Date   CHOL 145 12/10/2023   HDL 47.30 12/10/2023   LDLCALC 81 12/10/2023   LDLDIRECT 116.0 06/30/2018   TRIG 83.0 12/10/2023   CHOLHDL 3 12/10/2023   Fenofibrate  54 mg daily  Last LDL improved with better diet   No missed doses  Not fasting today  Last ate at 1 pm- bbq chicken/baked   Still eating very healthy overall  Portion sizes are smaller than in the past  Cut back on sugar  Drinks only water   No etoh       Patient Active Problem List   Diagnosis Date Noted   GERD (gastroesophageal reflux disease) 12/19/2024   Colon cancer screening 12/09/2022   Mild sleep apnea 12/04/2021   Routine general medical examination at a health care facility 07/31/2019   IBS (irritable bowel syndrome) 12/27/2014  Seasonal allergies 06/01/2013   Hypertriglyceridemia 05/17/2013   Hypothyroidism 05/17/2013   Past Medical History:  Diagnosis Date   History of chicken pox    History of hyperlipidemia    Sleep apnea    Thyroid  disease    Past Surgical History:  Procedure Laterality Date   APPENDECTOMY  12/15/2009   COLONOSCOPY WITH PROPOFOL  N/A 03/09/2023   Procedure: COLONOSCOPY WITH PROPOFOL ;  Surgeon: Unk Corinn Skiff, MD;  Location: [facility] END[facility]COPY;  Service: Gastroenterology;  Laterality: N/A;   ELBOW SURGERY Right 2015   HAND SURGERY  12/16/1995   Social History[1] Family History  Problem Relation Age of Onset   Healthy Mother    Healthy Father    Hyperlipidemia Maternal Grandmother    Stroke Maternal Grandmother    Hypertension Maternal Grandmother    Diabetes Paternal Grandmother        well controlled with good diet   Cancer - Lung Paternal Grandfather    Allergies[2] Medications Ordered Prior to Encounter[3]  Review of Systems  Constitutional:  Negative for activity change,  appetite change, fatigue, fever and unexpected weight change.  HENT:  Negative for congestion, rhinorrhea, sore throat and trouble swallowing.   Eyes:  Negative for pain, redness, itching and visual disturbance.  Respiratory:  Negative for cough, chest tightness, shortness of breath and wheezing.   Cardiovascular:  Negative for chest pain and palpitations.  Gastrointestinal:  Negative for abdominal pain, blood in stool, constipation, diarrhea and nausea.  Endocrine: Negative for cold intolerance, heat intolerance, polydipsia and polyuria.  Genitourinary:  Negative for difficulty urinating, dysuria, frequency and urgency.  Musculoskeletal:  Negative for arthralgias, joint swelling and myalgias.  Skin:  Negative for pallor and rash.  Neurological:  Negative for dizziness, tremors, weakness, numbness and headaches.  Hematological:  Negative for adenopathy. Does not bruise/bleed easily.  Psychiatric/Behavioral:  Negative for decreased concentration and dysphoric mood. The patient is not nervous/anxious.        Objective:   Physical Exam Constitutional:      General: He is not in acute distress.    Appearance: Normal appearance. He is well-developed and normal weight. He is not ill-appearing or diaphoretic.  HENT:     Head: Normocephalic and atraumatic.     Right Ear: Tympanic membrane, ear canal and external ear normal.     Left Ear: Tympanic membrane, ear canal and external ear normal.     Nose: Nose normal. No congestion.     Mouth/Throat:     Mouth: Mucous membranes are moist.     Pharynx: Oropharynx is clear. No posterior oropharyngeal erythema.  Eyes:     General: No scleral icterus.       Right eye: No discharge.        Left eye: No discharge.     Conjunctiva/sclera: Conjunctivae normal.     Pupils: Pupils are equal, round, and reactive to light.  Neck:     Thyroid : No thyromegaly.     Vascular: No carotid bruit or JVD.     Comments: No thyroid  enlargement  Cardiovascular:      Rate and Rhythm: Normal rate and regular rhythm.     Pulses: Normal pulses.     Heart sounds: Normal heart sounds.     No gallop.  Pulmonary:     Effort: Pulmonary effort is normal. No respiratory distress.     Breath sounds: Normal breath sounds. No wheezing or rales.     Comments: Good air exch Chest:     Chest wall:  No tenderness.  Abdominal:     General: Bowel sounds are normal. There is no distension or abdominal bruit.     Palpations: Abdomen is soft. There is no mass.     Tenderness: There is no abdominal tenderness.     Hernia: No hernia is present.  Musculoskeletal:        General: No tenderness.     Cervical back: Normal range of motion and neck supple. No rigidity. No muscular tenderness.     Right lower leg: No edema.     Left lower leg: No edema.  Lymphadenopathy:     Cervical: No cervical adenopathy.  Skin:    General: Skin is warm and dry.     Coloration: Skin is not pale.     Findings: No erythema or rash.     Comments: Solar lentigines diffusely  Few sks  Neurological:     Mental Status: He is alert.     Cranial Nerves: No cranial nerve deficit.     Motor: No abnormal muscle tone.     Coordination: Coordination normal.     Gait: Gait normal.     Deep Tendon Reflexes: Reflexes are normal and symmetric. Reflexes normal.  Psychiatric:        Mood and Affect: Mood normal.        Cognition and Memory: Cognition and memory normal.           Assessment & Plan:   Problem List Items Addressed This Visit       Digestive   GERD (gastroesophageal reflux disease)   Takes pepcid  20 mg at bedtime Well controlled Improved after weight loss         Endocrine   Hypothyroidism   TSH planned  Taking levothyroxine  100 mcg daily  Last visit TSH up but decided not to increase dose due to no clinical changes       Relevant Orders   TSH     Other   Routine general medical examination at a health care facility - Primary   Reviewed health habits  including diet and exercise and skin cancer prevention Reviewed appropriate screening tests for age  Also reviewed health mt list, fam hx and immunization status , as well as social and family history   See HPI Labs reviewed and ordered Health Maintenance  Topic Date Due   Hepatitis B Vaccine (1 of 3 - 19+ 3-dose series) Never done   COVID-19 Vaccine (3 - 2025-26 season) 01/04/2025*   Flu Shot  03/14/2025*   HIV Screening  05/28/2028*   DTaP/Tdap/Td vaccine (2 - Td or Tdap) 06/25/2027   Colon Cancer Screening  03/08/2033   Hepatitis C Screening  Completed   Pneumococcal Vaccine  Aged Out   HPV Vaccine  Aged Out   Meningitis B Vaccine  Aged Out  *Topic was postponed. The date shown is not the original due date.    Declines flu shot  No voiding changes or fam history of prostate cancer Great health habits Counseled re: skin cancer prevention  PHQ 0 Discussed fall prevention, supplements and exercise for bone density        Relevant Orders   CBC with Differential/Platelet   Comprehensive metabolic panel with GFR   Lipid panel   TSH   Hypertriglyceridemia   Disc goals for lipids and reasons to control them Rev last labs with pt Rev low sat fat diet in detail  Continues to eat well  Taking fenofibrate  54 mg daily  Labs planned/fasting       Relevant Orders   Lipid panel   Colon cancer screening   Colonoscopy utd 02/2023 with 10 y recall          [1]  Social History Tobacco Use   Smoking status: Never    Passive exposure: Past   Smokeless tobacco: Never  Vaping Use   Vaping status: Never Used  Substance Use Topics   Alcohol use: Yes    Alcohol/week: 0.0 standard drinks of alcohol    Comment: occ   Drug use: No  [2] No Known Allergies [3]  Current Outpatient Medications on File Prior to Visit  Medication Sig Dispense Refill   famotidine  (PEPCID ) 20 MG tablet TAKE 1 TABLET BY MOUTH TWICE A DAY (Patient taking differently: Take 20 mg by mouth at bedtime.)  180 tablet 1   fenofibrate  54 MG tablet TAKE 1 TABLET BY MOUTH DAILY 90 tablet 3   indomethacin  (INDOCIN ) 50 MG capsule Take 1 capsule (50 mg total) by mouth 3 (three) times daily as needed for moderate pain (toe pain). With a meal 20 capsule 0   levocetirizine (XYZAL ) 5 MG tablet TAKE 1 TABLET BY MOUTH EVERY DAY IN THE EVENING 90 tablet 1   levothyroxine  (SYNTHROID ) 100 MCG tablet TAKE 1 TABLET BY MOUTH DAILY BEFORE BREAKFAST. 90 tablet 2   No current facility-administered medications on file prior to visit.   "

## 2024-12-19 NOTE — Assessment & Plan Note (Signed)
 Disc goals for lipids and reasons to control them Rev last labs with pt Rev low sat fat diet in detail  Continues to eat well  Taking fenofibrate  54 mg daily   Labs planned/fasting

## 2024-12-23 ENCOUNTER — Other Ambulatory Visit

## 2024-12-23 DIAGNOSIS — E039 Hypothyroidism, unspecified: Secondary | ICD-10-CM | POA: Diagnosis not present

## 2024-12-23 DIAGNOSIS — Z0000 Encounter for general adult medical examination without abnormal findings: Secondary | ICD-10-CM

## 2024-12-23 DIAGNOSIS — E781 Pure hyperglyceridemia: Secondary | ICD-10-CM | POA: Diagnosis not present

## 2024-12-23 LAB — CBC WITH DIFFERENTIAL/PLATELET
Basophils Absolute: 0.1 K/uL (ref 0.0–0.1)
Basophils Relative: 1 % (ref 0.0–3.0)
Eosinophils Absolute: 0.1 K/uL (ref 0.0–0.7)
Eosinophils Relative: 1.8 % (ref 0.0–5.0)
HCT: 44.1 % (ref 39.0–52.0)
Hemoglobin: 15.3 g/dL (ref 13.0–17.0)
Lymphocytes Relative: 36.3 % (ref 12.0–46.0)
Lymphs Abs: 2.2 K/uL (ref 0.7–4.0)
MCHC: 34.6 g/dL (ref 30.0–36.0)
MCV: 85.6 fl (ref 78.0–100.0)
Monocytes Absolute: 0.4 K/uL (ref 0.1–1.0)
Monocytes Relative: 6.6 % (ref 3.0–12.0)
Neutro Abs: 3.3 K/uL (ref 1.4–7.7)
Neutrophils Relative %: 54.3 % (ref 43.0–77.0)
Platelets: 238 K/uL (ref 150.0–400.0)
RBC: 5.15 Mil/uL (ref 4.22–5.81)
RDW: 12.9 % (ref 11.5–15.5)
WBC: 6.1 K/uL (ref 4.0–10.5)

## 2024-12-23 LAB — LIPID PANEL
Cholesterol: 146 mg/dL (ref 28–200)
HDL: 47 mg/dL
LDL Cholesterol: 85 mg/dL (ref 10–99)
NonHDL: 98.73
Total CHOL/HDL Ratio: 3
Triglycerides: 68 mg/dL (ref 10.0–149.0)
VLDL: 13.6 mg/dL (ref 0.0–40.0)

## 2024-12-23 LAB — COMPREHENSIVE METABOLIC PANEL WITH GFR
ALT: 14 U/L (ref 3–53)
AST: 17 U/L (ref 5–37)
Albumin: 4.4 g/dL (ref 3.5–5.2)
Alkaline Phosphatase: 54 U/L (ref 39–117)
BUN: 19 mg/dL (ref 6–23)
CO2: 29 meq/L (ref 19–32)
Calcium: 9.1 mg/dL (ref 8.4–10.5)
Chloride: 105 meq/L (ref 96–112)
Creatinine, Ser: 1.24 mg/dL (ref 0.40–1.50)
GFR: 69.4 mL/min
Glucose, Bld: 94 mg/dL (ref 70–99)
Potassium: 4.7 meq/L (ref 3.5–5.1)
Sodium: 138 meq/L (ref 135–145)
Total Bilirubin: 0.5 mg/dL (ref 0.2–1.2)
Total Protein: 6.3 g/dL (ref 6.0–8.3)

## 2024-12-23 LAB — TSH: TSH: 5 u[IU]/mL (ref 0.35–5.50)

## 2024-12-25 ENCOUNTER — Ambulatory Visit: Payer: Self-pay | Admitting: Family Medicine

## 2024-12-26 ENCOUNTER — Other Ambulatory Visit: Payer: Self-pay | Admitting: Family Medicine

## 2024-12-26 MED ORDER — FENOFIBRATE 54 MG PO TABS: 54.0000 mg | ORAL_TABLET | Freq: Every day | ORAL | 3 refills | Status: AC
